# Patient Record
Sex: Male | Born: 1965 | Race: White | Hispanic: No | Marital: Single | Smoking: Current every day smoker
Health system: Southern US, Community
[De-identification: ages and names within clinical notes are randomized; demographics above are authoritative.]

## PROBLEM LIST (undated history)

## (undated) DIAGNOSIS — C801 Malignant (primary) neoplasm, unspecified: Secondary | ICD-10-CM

## (undated) DIAGNOSIS — N289 Disorder of kidney and ureter, unspecified: Secondary | ICD-10-CM

## (undated) DIAGNOSIS — I10 Essential (primary) hypertension: Secondary | ICD-10-CM

## (undated) HISTORY — PX: CARDIAC SURGERY: SHX584

## (undated) HISTORY — PX: LITHOTRIPSY: SUR834

## (undated) HISTORY — PX: CYSTOSCOPY: SUR368

---

## 2018-02-01 ENCOUNTER — Other Ambulatory Visit: Payer: Self-pay

## 2018-02-01 ENCOUNTER — Emergency Department (HOSPITAL_COMMUNITY): Payer: Self-pay

## 2018-02-01 ENCOUNTER — Encounter (HOSPITAL_COMMUNITY): Payer: Self-pay

## 2018-02-01 ENCOUNTER — Emergency Department (HOSPITAL_COMMUNITY)
Admission: EM | Admit: 2018-02-01 | Discharge: 2018-02-01 | Disposition: A | Discharge status: Home / Self Care | Payer: Self-pay | Attending: Emergency Medicine | Admitting: Emergency Medicine

## 2018-02-01 DIAGNOSIS — R1030 Lower abdominal pain, unspecified: Secondary | ICD-10-CM | POA: Insufficient documentation

## 2018-02-01 DIAGNOSIS — R109 Unspecified abdominal pain: Secondary | ICD-10-CM

## 2018-02-01 DIAGNOSIS — J069 Acute upper respiratory infection, unspecified: Secondary | ICD-10-CM | POA: Insufficient documentation

## 2018-02-01 DIAGNOSIS — Z859 Personal history of malignant neoplasm, unspecified: Secondary | ICD-10-CM | POA: Insufficient documentation

## 2018-02-01 DIAGNOSIS — F17290 Nicotine dependence, other tobacco product, uncomplicated: Secondary | ICD-10-CM | POA: Insufficient documentation

## 2018-02-01 DIAGNOSIS — I10 Essential (primary) hypertension: Secondary | ICD-10-CM | POA: Insufficient documentation

## 2018-02-01 DIAGNOSIS — R7989 Other specified abnormal findings of blood chemistry: Secondary | ICD-10-CM | POA: Insufficient documentation

## 2018-02-01 HISTORY — DX: Disorder of kidney and ureter, unspecified: N28.9

## 2018-02-01 HISTORY — DX: Essential (primary) hypertension: I10

## 2018-02-01 HISTORY — DX: Malignant (primary) neoplasm, unspecified: C80.1

## 2018-02-01 LAB — URINALYSIS, ROUTINE W REFLEX MICROSCOPIC
BILIRUBIN URINE: NEGATIVE
Bacteria, UA: NONE SEEN
Glucose, UA: NEGATIVE mg/dL
KETONES UR: NEGATIVE mg/dL
LEUKOCYTES UA: NEGATIVE
NITRITE: NEGATIVE
Protein, ur: NEGATIVE mg/dL
RBC / HPF: >50 RBC/hpf — ABNORMAL HIGH (ref 0–5)
SPECIFIC GRAVITY, URINE: 1.013 (ref 1.005–1.030)
pH: 6 (ref 5.0–8.0)

## 2018-02-01 LAB — CBC WITH DIFFERENTIAL/PLATELET
Abs Immature Granulocytes: 0.02 10*3/uL (ref 0.00–0.07)
Basophils Absolute: 0.1 10*3/uL (ref 0.0–0.1)
Basophils Relative: 1 %
EOS PCT: 6 %
Eosinophils Absolute: 0.3 10*3/uL (ref 0.0–0.5)
HEMATOCRIT: 43.8 % (ref 39.0–52.0)
HEMOGLOBIN: 13.6 g/dL (ref 13.0–17.0)
Immature Granulocytes: 0 %
LYMPHS PCT: 33 %
Lymphs Abs: 1.7 10*3/uL (ref 0.7–4.0)
MCH: 28 pg (ref 26.0–34.0)
MCHC: 31.1 g/dL (ref 30.0–36.0)
MCV: 90.3 fL (ref 80.0–100.0)
MONO ABS: 0.5 10*3/uL (ref 0.1–1.0)
Monocytes Relative: 9 %
Neutro Abs: 2.7 10*3/uL (ref 1.7–7.7)
Neutrophils Relative %: 51 %
Platelets: 231 10*3/uL (ref 150–400)
RBC: 4.85 MIL/uL (ref 4.22–5.81)
RDW: 13.1 % (ref 11.5–15.5)
WBC: 5.2 10*3/uL (ref 4.0–10.5)
nRBC: 0 % (ref 0.0–0.2)

## 2018-02-01 LAB — BASIC METABOLIC PANEL
Anion gap: 11 (ref 5–15)
BUN: 27 mg/dL — AB (ref 6–20)
CHLORIDE: 109 mmol/L (ref 98–111)
CO2: 25 mmol/L (ref 22–32)
CREATININE: 2.06 mg/dL — AB (ref 0.61–1.24)
Calcium: 9.7 mg/dL (ref 8.9–10.3)
GFR calc Af Amer: 41 mL/min — ABNORMAL LOW (ref >60)
GFR calc non Af Amer: 35 mL/min — ABNORMAL LOW (ref >60)
Glucose, Bld: 100 mg/dL — ABNORMAL HIGH (ref 70–99)
Potassium: 4.3 mmol/L (ref 3.5–5.1)
SODIUM: 145 mmol/L (ref 135–145)

## 2018-02-01 MED ORDER — ALBUTEROL SULFATE HFA 108 (90 BASE) MCG/ACT IN AERS
2.0000 | INHALATION_SPRAY | Freq: Once | RESPIRATORY_TRACT | Status: AC
  Administered 2018-02-01: 2 via RESPIRATORY_TRACT
  Filled 2018-02-01: qty 6.7

## 2018-02-01 MED ORDER — DEXAMETHASONE SODIUM PHOSPHATE 10 MG/ML IJ SOLN
10.0000 mg | Freq: Once | INTRAMUSCULAR | Status: DC
  Filled 2018-02-01: qty 1

## 2018-02-01 MED ORDER — PREDNISONE 20 MG PO TABS: 60.0000 mg | ORAL_TABLET | Freq: Every day | ORAL | 0 refills | Status: AC

## 2018-02-01 MED ORDER — DEXAMETHASONE SODIUM PHOSPHATE 10 MG/ML IJ SOLN
10.0000 mg | Freq: Once | INTRAMUSCULAR | Status: AC
  Administered 2018-02-01: 10 mg via INTRAMUSCULAR

## 2018-02-01 NOTE — ED Notes (Signed)
Unable to get blood work °

## 2018-02-01 NOTE — ED Triage Notes (Signed)
Patient arrives with initiial complaint left sided flank pain. Hx 3 lithotripsy's and 4 cystoscopes. Patient states pain had felt similar to kidney stones but when to the bathroom and thinks stone has been passed. Patient denies any current pain at this time.

## 2018-02-01 NOTE — Discharge Instructions (Signed)
Please take medications as directed.   Today your laboratory work showed an elevated BUN and creatinine at 27 and 2.06 respectively.  You will need to have your lab work redrawn in the next 3 to 5 days to recheck these levels.  Please follow up with your primary care provider within 3-5 days for re-evaluation of your symptoms. If you do not have a primary care provider, information for a healthcare clinic has been provided for you to make arrangements for follow up care. Please return to the emergency department for any new or worsening symptoms.

## 2018-02-01 NOTE — ED Notes (Signed)
Pt stated that he has had a moist productive cough x 4 days. Pt has expiratory wheezing bilaterally. Denies shortness of breath.

## 2018-02-01 NOTE — ED Notes (Signed)
Stone noted in urine cup upon giving urine sample.

## 2018-02-01 NOTE — ED Provider Notes (Signed)
[facility] DEPT Provider Note   CSN: [id number] Arrival date & time: 02/01/18  1515     History   Chief Complaint Chief Complaint  Patient presents with  . Flank Pain    HPI Robert Shepherd is a 52 y.o. male.  HPI   Patient is a 52 year old male with a history of CAD status post CABG, hypertension, hyperlipidemia, prediabetes, renal carcinoma status post bilateral partial nephrectomies, nephrolithiasis, who presents the emergency department today complaining of left-sided flank pain that began several days ago.  Patient states that his pain is consistent with prior kidney stones.  States he has a very long history of kidney stones and that he has had 82 of them in the past.  Has had 3 lithotripsies and 4 cystoscopies in the past.  States that while in the waiting room he urinated and passed the stone in the urine cup.  After that pain resolved.  Still reports that he has some mild suprapubic abdominal pain and dysuria, but otherwise his symptoms have resolved.  Denies significant frequency, urgency or hematuria.  No nausea, vomiting, diarrhea or constipation.  No chest pain or shortness of breath.  Of note he reports that he has had some recent URI symptoms.  About 3 days ago he developed a sore throat, he then developed nasal congestion and a productive cough.  Has had fevers up to 101F.  Denies any shortness of breath or chest pain.  Has had some wheezing which she does not normally have.  Patient vapes.  He denies a history of COPD. Has been taking delsym with improvement of his sxs.  Has been around sick contacts with similar symptoms.  Patient just moved to the area and is trying to get established with a PCP.  Past Medical History:  Diagnosis Date  . Cancer ([place])   . Hypertension   . Renal disorder     There are no active problems to display for this patient.   Past Surgical History:  Procedure Laterality Date  . CARDIAC SURGERY    .  CYSTOSCOPY    . LITHOTRIPSY          Home Medications    Prior to Admission medications   Medication Sig Start Date End Date Taking? Authorizing Provider  predniSONE (DELTASONE) 20 MG tablet Take 3 tablets (60 mg total) by mouth daily for 6 days. 02/01/18 02/07/18  Beauty Pless S, PA-C    Family History History reviewed. No pertinent family history.  Social History Social History   Tobacco Use  . Smoking status: Current Every Day Smoker    Types: E-cigarettes  Substance Use Topics  . Alcohol use: Not Currently  . Drug use: Not Currently     Allergies   Toradol [ketorolac tromethamine] and Sulfa antibiotics   Review of Systems Review of Systems  Constitutional: Positive for chills and fever.  HENT: Positive for congestion, postnasal drip and sore throat (resolved). Negative for rhinorrhea.   Eyes: Negative for visual disturbance.  Respiratory: Positive for cough and wheezing. Negative for shortness of breath.   Cardiovascular: Negative for chest pain and leg swelling.  Gastrointestinal: Positive for abdominal pain (suprapubic). Negative for constipation, diarrhea, nausea and vomiting.  Genitourinary: Positive for flank pain (resolved). Negative for dysuria, frequency, hematuria and urgency.  Musculoskeletal: Negative for back pain.  Skin: Negative for rash.  Neurological: Negative for dizziness, weakness, light-headedness, numbness and headaches.     Physical Exam Updated Vital Signs BP (!) 155/115 Comment: will take  medications ASAP  Pulse 88   Temp 98.1 F (36.7 C) (Oral)   Resp 20   Ht 5\' 5"  (1.651 m)   Wt 65.8 kg   SpO2 99%   BMI 24.13 kg/m   Physical Exam  Constitutional: He appears well-developed and well-nourished. No distress.  HENT:  Head: Normocephalic and atraumatic.  Mouth/Throat: Oropharynx is clear and moist.  Mild pharyngeal erythema. No tonsillar swelling or exudates.  Eyes: Conjunctivae are normal.  Neck: Neck supple.    Cardiovascular: Normal rate, regular rhythm and normal heart sounds.  No murmur heard. Pulmonary/Chest: Effort normal. No stridor. No respiratory distress. He has wheezes. He has no rales.  Decreased breath sounds throughout  Abdominal: Soft. Bowel sounds are normal. He exhibits no distension. There is no tenderness. There is no guarding.  Mild bilat CVA TTP  Musculoskeletal: Normal range of motion.  Neurological: He is alert.  Skin: Skin is warm and dry. He is not diaphoretic.  Psychiatric: He has a normal mood and affect.  Nursing note and vitals reviewed.    ED Treatments / Results  Labs (all labs ordered are listed, but only abnormal results are displayed) Labs Reviewed  URINALYSIS, ROUTINE W REFLEX MICROSCOPIC - Abnormal; Notable for the following components:      Result Value   Hgb urine dipstick LARGE (*)    RBC / HPF >50 (*)    All other components within normal limits  BASIC METABOLIC PANEL - Abnormal; Notable for the following components:   Glucose, Bld 100 (*)    BUN 27 (*)    Creatinine, Ser 2.06 (*)    GFR calc non Af Amer 35 (*)    GFR calc Af Amer 41 (*)    All other components within normal limits  URINE CULTURE  CBC WITH DIFFERENTIAL/PLATELET    EKG None  Radiology Dg Chest 2 View  Result Date: 02/01/2018 CLINICAL DATA:  Cough. EXAM: CHEST - 2 VIEW COMPARISON:  None. FINDINGS: Sequelae of left atrial appendage clipping are identified. The cardiomediastinal silhouette is within normal limits. No airspace consolidation, edema, pleural effusion, or pneumothorax is identified. Multiple surgical clips are present in the right upper abdomen/retroperitoneum. Degenerative changes are partially visualized at the left shoulder. IMPRESSION: No active cardiopulmonary disease. Electronically Signed   By: Logan Bores M.D.   On: 02/01/2018 17:41    Procedures Procedures (including critical care time)  Medications Ordered in ED Medications  albuterol (PROVENTIL  HFA;VENTOLIN HFA) 108 (90 Base) MCG/ACT inhaler 2 puff (2 puffs Inhalation Given 02/01/18 1730)  dexamethasone (DECADRON) injection 10 mg (10 mg Intramuscular Given 02/01/18 1730)     Initial Impression / Assessment and Plan / ED Course  I have reviewed the triage vital signs and the nursing notes.  Pertinent labs & imaging results that were available during my care of the patient were reviewed by me and considered in my medical decision making (see chart for details).  7:10 PM A consult was placed to case management to assist patient with obtaining primary care physician in the area.  Attempted contacting case management. Voicemail was left.  Final Clinical Impressions(s) / ED Diagnoses   Final diagnoses:  Flank pain  Elevated serum creatinine  Upper respiratory tract infection, unspecified type   Patient with left flank pain consistent with prior history of nephrolithiasis.  Passed stone while waiting to be seen.  UA with hematuria and >50 RBC.  No leukocytes or nitrites.  No evidence of UTI.  Will send for culture  given his dysuria.  Abdominal exam is benign and patient states that his symptoms have resolved since passing the stone in the ED. CBC without leukocytosis. BMP with elevated BUN and Cr to 27 and 2.06 respectively.  Unable to view prior lab work as patient is new to the area.  He states that his normal creatinine is around 1.6, though he has had issues with dehydration in the past and has sometimes had elevations in this number.  Discussed that patient will need to increase his hydration over the next several days and will need to follow-up for repeat lab work within the next 3 to 5 days.  He was given information for the [facility] and [facility] to have repeat labs drawn.  I also placed a consult to case management as above to help get him plugged in with primary care as soon as possible.  I advised patient of this plan.  Suspect his symptoms  today were due to ureteral stone.  Will give urology follow-up if he continues to have persistent symptoms of flank pain.  Have advised him to return if he experiences fevers, chills, vomiting abdominal pain or other signs of an infected stone.  Patient also with cough, fevers and other URI symptoms for the last several days.  Has wheezing throughout on exam and decreased airflow.  Decadron and albuterol inhaler given in the ED and patient feels improved.  Lung sounds improved.  CXR without evidence of pneumonia.  Will have patient continue albuterol inhaler as an outpatient until symptoms improve.  We will also give Rx for cough medication.  Suspect his symptoms are viral in nature.  We will have him follow-up with the [facility] and return if worse.  He voices understanding of the plan and reasons to return to the ED.  All questions answered.  ED Discharge Orders         Ordered    predniSONE (DELTASONE) 20 MG tablet  Daily     02/01/18 [address], [place] 02/01/18 1917    Gareth Morgan, MD 02/04/18 1205

## 2018-02-03 LAB — URINE CULTURE: Culture: NO GROWTH

## 2018-02-09 ENCOUNTER — Ambulatory Visit: Payer: Self-pay | Admitting: Family Medicine

## 2018-05-12 ENCOUNTER — Encounter (HOSPITAL_COMMUNITY): Payer: Self-pay

## 2018-05-12 ENCOUNTER — Emergency Department (HOSPITAL_COMMUNITY): Payer: Self-pay

## 2018-05-12 ENCOUNTER — Other Ambulatory Visit: Payer: Self-pay

## 2018-05-12 ENCOUNTER — Observation Stay (HOSPITAL_COMMUNITY)
Admission: EM | Admit: 2018-05-12 | Discharge: 2018-05-13 | Disposition: A | Discharge status: Home / Self Care | Payer: Self-pay | Attending: Internal Medicine | Admitting: Internal Medicine

## 2018-05-12 DIAGNOSIS — Z85528 Personal history of other malignant neoplasm of kidney: Secondary | ICD-10-CM | POA: Insufficient documentation

## 2018-05-12 DIAGNOSIS — I130 Hypertensive heart and chronic kidney disease with heart failure and stage 1 through stage 4 chronic kidney disease, or unspecified chronic kidney disease: Secondary | ICD-10-CM | POA: Insufficient documentation

## 2018-05-12 DIAGNOSIS — I509 Heart failure, unspecified: Secondary | ICD-10-CM | POA: Insufficient documentation

## 2018-05-12 DIAGNOSIS — I160 Hypertensive urgency: Secondary | ICD-10-CM | POA: Diagnosis present

## 2018-05-12 DIAGNOSIS — I2510 Atherosclerotic heart disease of native coronary artery without angina pectoris: Secondary | ICD-10-CM | POA: Insufficient documentation

## 2018-05-12 DIAGNOSIS — I161 Hypertensive emergency: Secondary | ICD-10-CM | POA: Insufficient documentation

## 2018-05-12 DIAGNOSIS — Z791 Long term (current) use of non-steroidal anti-inflammatories (NSAID): Secondary | ICD-10-CM | POA: Insufficient documentation

## 2018-05-12 DIAGNOSIS — R000 Tachycardia, unspecified: Secondary | ICD-10-CM | POA: Insufficient documentation

## 2018-05-12 DIAGNOSIS — Z882 Allergy status to sulfonamides status: Secondary | ICD-10-CM | POA: Insufficient documentation

## 2018-05-12 DIAGNOSIS — Z79899 Other long term (current) drug therapy: Secondary | ICD-10-CM | POA: Insufficient documentation

## 2018-05-12 DIAGNOSIS — K760 Fatty (change of) liver, not elsewhere classified: Secondary | ICD-10-CM | POA: Insufficient documentation

## 2018-05-12 DIAGNOSIS — R109 Unspecified abdominal pain: Principal | ICD-10-CM | POA: Insufficient documentation

## 2018-05-12 DIAGNOSIS — I25810 Atherosclerosis of coronary artery bypass graft(s) without angina pectoris: Secondary | ICD-10-CM

## 2018-05-12 DIAGNOSIS — N200 Calculus of kidney: Secondary | ICD-10-CM | POA: Insufficient documentation

## 2018-05-12 DIAGNOSIS — I712 Thoracic aortic aneurysm, without rupture: Secondary | ICD-10-CM | POA: Insufficient documentation

## 2018-05-12 DIAGNOSIS — Z905 Acquired absence of kidney: Secondary | ICD-10-CM | POA: Insufficient documentation

## 2018-05-12 DIAGNOSIS — N400 Enlarged prostate without lower urinary tract symptoms: Secondary | ICD-10-CM | POA: Insufficient documentation

## 2018-05-12 DIAGNOSIS — Z951 Presence of aortocoronary bypass graft: Secondary | ICD-10-CM | POA: Insufficient documentation

## 2018-05-12 DIAGNOSIS — F1510 Other stimulant abuse, uncomplicated: Secondary | ICD-10-CM | POA: Insufficient documentation

## 2018-05-12 DIAGNOSIS — E785 Hyperlipidemia, unspecified: Secondary | ICD-10-CM | POA: Insufficient documentation

## 2018-05-12 DIAGNOSIS — N183 Chronic kidney disease, stage 3 (moderate): Secondary | ICD-10-CM | POA: Insufficient documentation

## 2018-05-12 DIAGNOSIS — Z888 Allergy status to other drugs, medicaments and biological substances status: Secondary | ICD-10-CM | POA: Insufficient documentation

## 2018-05-12 LAB — RAPID URINE DRUG SCREEN, HOSP PERFORMED
Amphetamines: NOT DETECTED
Barbiturates: NOT DETECTED
Benzodiazepines: NOT DETECTED
Cocaine: NOT DETECTED
Opiates: NOT DETECTED
Tetrahydrocannabinol: NOT DETECTED

## 2018-05-12 LAB — CBC WITH DIFFERENTIAL/PLATELET
Abs Immature Granulocytes: 0.02 10*3/uL (ref 0.00–0.07)
Basophils Absolute: 0.1 10*3/uL (ref 0.0–0.1)
Basophils Relative: 2 %
Eosinophils Absolute: 0.8 10*3/uL — ABNORMAL HIGH (ref 0.0–0.5)
Eosinophils Relative: 11 %
HCT: 48.1 % (ref 39.0–52.0)
Hemoglobin: 15.7 g/dL (ref 13.0–17.0)
Immature Granulocytes: 0 %
Lymphocytes Relative: 20 %
Lymphs Abs: 1.4 10*3/uL (ref 0.7–4.0)
MCH: 27.9 pg (ref 26.0–34.0)
MCHC: 32.6 g/dL (ref 30.0–36.0)
MCV: 85.4 fL (ref 80.0–100.0)
Monocytes Absolute: 0.5 10*3/uL (ref 0.1–1.0)
Monocytes Relative: 8 %
Neutro Abs: 4 10*3/uL (ref 1.7–7.7)
Neutrophils Relative %: 59 %
Platelets: 289 10*3/uL (ref 150–400)
RBC: 5.63 MIL/uL (ref 4.22–5.81)
RDW: 12.8 % (ref 11.5–15.5)
WBC: 6.9 10*3/uL (ref 4.0–10.5)
nRBC: 0 % (ref 0.0–0.2)

## 2018-05-12 LAB — BASIC METABOLIC PANEL
Anion gap: 9 (ref 5–15)
BUN: 21 mg/dL — ABNORMAL HIGH (ref 6–20)
CO2: 22 mmol/L (ref 22–32)
Calcium: 9.5 mg/dL (ref 8.9–10.3)
Chloride: 106 mmol/L (ref 98–111)
Creatinine, Ser: 1.55 mg/dL — ABNORMAL HIGH (ref 0.61–1.24)
GFR calc Af Amer: 59 mL/min — ABNORMAL LOW (ref >60)
GFR calc non Af Amer: 51 mL/min — ABNORMAL LOW (ref >60)
Glucose, Bld: 102 mg/dL — ABNORMAL HIGH (ref 70–99)
Potassium: 4.1 mmol/L (ref 3.5–5.1)
Sodium: 137 mmol/L (ref 135–145)

## 2018-05-12 LAB — URINALYSIS, ROUTINE W REFLEX MICROSCOPIC
Bacteria, UA: NONE SEEN
Bilirubin Urine: NEGATIVE
Glucose, UA: NEGATIVE mg/dL
Hgb urine dipstick: NEGATIVE
Ketones, ur: NEGATIVE mg/dL
Leukocytes, UA: NEGATIVE
Nitrite: NEGATIVE
Protein, ur: 100 mg/dL — AB
Specific Gravity, Urine: 1.015 (ref 1.005–1.030)
pH: 6 (ref 5.0–8.0)

## 2018-05-12 LAB — HEPATIC FUNCTION PANEL
ALT: 19 U/L (ref 0–44)
AST: 18 U/L (ref 15–41)
Albumin: 4.1 g/dL (ref 3.5–5.0)
Alkaline Phosphatase: 60 U/L (ref 38–126)
Bilirubin, Direct: 0.2 mg/dL (ref 0.0–0.2)
Indirect Bilirubin: 0.7 mg/dL (ref 0.3–0.9)
Total Bilirubin: 0.9 mg/dL (ref 0.3–1.2)
Total Protein: 7.2 g/dL (ref 6.5–8.1)

## 2018-05-12 LAB — CREATININE, SERUM
Creatinine, Ser: 1.59 mg/dL — ABNORMAL HIGH (ref 0.61–1.24)
GFR calc Af Amer: 57 mL/min — ABNORMAL LOW (ref >60)
GFR calc non Af Amer: 49 mL/min — ABNORMAL LOW (ref >60)

## 2018-05-12 LAB — D-DIMER, QUANTITATIVE: D-Dimer, Quant: 0.36 ug/mL-FEU (ref 0.00–0.50)

## 2018-05-12 LAB — CBC
HCT: 43.9 % (ref 39.0–52.0)
Hemoglobin: 15 g/dL (ref 13.0–17.0)
MCH: 29 pg (ref 26.0–34.0)
MCHC: 34.2 g/dL (ref 30.0–36.0)
MCV: 84.9 fL (ref 80.0–100.0)
Platelets: 279 10*3/uL (ref 150–400)
RBC: 5.17 MIL/uL (ref 4.22–5.81)
RDW: 13.1 % (ref 11.5–15.5)
WBC: 8.3 10*3/uL (ref 4.0–10.5)
nRBC: 0 % (ref 0.0–0.2)

## 2018-05-12 LAB — BRAIN NATRIURETIC PEPTIDE: B Natriuretic Peptide: 586.3 pg/mL — ABNORMAL HIGH (ref 0.0–100.0)

## 2018-05-12 LAB — MAGNESIUM: Magnesium: 1.9 mg/dL (ref 1.7–2.4)

## 2018-05-12 LAB — PHOSPHORUS: Phosphorus: 3.4 mg/dL (ref 2.5–4.6)

## 2018-05-12 LAB — I-STAT TROPONIN, ED: Troponin i, poc: 0.04 ng/mL (ref 0.00–0.08)

## 2018-05-12 MED ORDER — CARVEDILOL PHOSPHATE ER 80 MG PO CP24
80.0000 mg | ORAL_CAPSULE | Freq: Every day | ORAL | Status: DC
  Administered 2018-05-12: 80 mg via ORAL
  Filled 2018-05-12 (×2): qty 1

## 2018-05-12 MED ORDER — CLONIDINE HCL 0.2 MG PO TABS
0.3000 mg | ORAL_TABLET | Freq: Three times a day (TID) | ORAL | Status: DC
  Administered 2018-05-12 – 2018-05-13 (×2): 0.3 mg via ORAL
  Filled 2018-05-12 (×2): qty 1

## 2018-05-12 MED ORDER — CARVEDILOL PHOSPHATE ER 20 MG PO CP24
80.0000 mg | ORAL_CAPSULE | Freq: Every day | ORAL | Status: DC
  Filled 2018-05-12 (×2): qty 1

## 2018-05-12 MED ORDER — IPRATROPIUM-ALBUTEROL 0.5-2.5 (3) MG/3ML IN SOLN
3.0000 mL | Freq: Four times a day (QID) | RESPIRATORY_TRACT | Status: DC
  Administered 2018-05-12: 3 mL via RESPIRATORY_TRACT

## 2018-05-12 MED ORDER — MORPHINE SULFATE (PF) 4 MG/ML IV SOLN
INTRAVENOUS | Status: AC
  Administered 2018-05-12: 13:00:00
  Filled 2018-05-12: qty 1

## 2018-05-12 MED ORDER — IPRATROPIUM-ALBUTEROL 0.5-2.5 (3) MG/3ML IN SOLN: 3.0000 mL | Freq: Four times a day (QID) | RESPIRATORY_TRACT | Status: DC | PRN

## 2018-05-12 MED ORDER — LIDOCAINE 5 % EX PTCH
1.0000 | MEDICATED_PATCH | CUTANEOUS | Status: DC
  Administered 2018-05-12: 1 via TRANSDERMAL
  Filled 2018-05-12: qty 1

## 2018-05-12 MED ORDER — IOPAMIDOL (ISOVUE-370) INJECTION 76%
100.0000 mL | Freq: Once | INTRAVENOUS | Status: AC | PRN
  Administered 2018-05-12: 100 mL via INTRAVENOUS

## 2018-05-12 MED ORDER — OXYCODONE-ACETAMINOPHEN 5-325 MG PO TABS: 1.0000 | ORAL_TABLET | ORAL | Status: DC | PRN

## 2018-05-12 MED ORDER — IOPAMIDOL (ISOVUE-370) INJECTION 76%
INTRAVENOUS | Status: AC
  Filled 2018-05-12: qty 100

## 2018-05-12 MED ORDER — LISINOPRIL 20 MG PO TABS
20.0000 mg | ORAL_TABLET | Freq: Two times a day (BID) | ORAL | Status: DC
  Administered 2018-05-12 – 2018-05-13 (×2): 20 mg via ORAL
  Filled 2018-05-12 (×2): qty 1

## 2018-05-12 MED ORDER — TAMSULOSIN HCL 0.4 MG PO CAPS
0.4000 mg | ORAL_CAPSULE | Freq: Every day | ORAL | Status: DC
  Administered 2018-05-13: 0.4 mg via ORAL
  Filled 2018-05-12: qty 1

## 2018-05-12 MED ORDER — CLONIDINE HCL 0.2 MG PO TABS
0.3000 mg | ORAL_TABLET | Freq: Once | ORAL | Status: AC
  Administered 2018-05-12: 0.3 mg via ORAL
  Filled 2018-05-12: qty 1

## 2018-05-12 MED ORDER — LABETALOL HCL 5 MG/ML IV SOLN
10.0000 mg | INTRAVENOUS | Status: DC | PRN
  Administered 2018-05-13: 10 mg via INTRAVENOUS
  Filled 2018-05-12: qty 4

## 2018-05-12 MED ORDER — LABETALOL HCL 5 MG/ML IV SOLN
10.0000 mg | Freq: Once | INTRAVENOUS | Status: AC
  Administered 2018-05-12: 10 mg via INTRAVENOUS
  Filled 2018-05-12: qty 4

## 2018-05-12 MED ORDER — HEPARIN SODIUM (PORCINE) 5000 UNIT/ML IJ SOLN
5000.0000 [IU] | Freq: Three times a day (TID) | INTRAMUSCULAR | Status: DC
  Administered 2018-05-12 – 2018-05-13 (×2): 5000 [IU] via SUBCUTANEOUS
  Filled 2018-05-12 (×2): qty 1

## 2018-05-12 MED ORDER — ATORVASTATIN CALCIUM 40 MG PO TABS
40.0000 mg | ORAL_TABLET | Freq: Every day | ORAL | Status: DC
  Administered 2018-05-13: 40 mg via ORAL
  Filled 2018-05-12: qty 1

## 2018-05-12 MED ORDER — AMLODIPINE BESYLATE 10 MG PO TABS
10.0000 mg | ORAL_TABLET | Freq: Every day | ORAL | Status: DC
  Administered 2018-05-12 – 2018-05-13 (×2): 10 mg via ORAL
  Filled 2018-05-12 (×2): qty 1

## 2018-05-12 NOTE — ED Notes (Signed)
Lidocaine patch removed, medication discontinued

## 2018-05-12 NOTE — ED Provider Notes (Signed)
Medical screening examination/treatment/procedure(s) were conducted as a shared visit with non-physician practitioner(s) and myself.  I personally evaluated the patient during the encounter. Briefly, the patient is a 53 y.o. male is a 53 year old male with history of cancer, hypertension who presents the ED with chest pain, flank pain, hypertension.  Patient with hypertension in the 517G systolic and elevated diastolic pressure as well.  Mildly tachycardic.  Patient on multiple blood pressure medications.  Has had some flank pain, chest pain.  No specific abdominal tenderness on exam.  Neurologically intact.  Patient has already been evaluated by the physician assistant lab work and imaging has been completed.  He has no signs of urinary tract infection.  Patient had negative UDS.  Troponin within normal limits.  EKG shows sinus tachycardia with T wave inversions throughout secondary to LVH.  No true ST elevation.  Patient with no significant leukocytosis, anemia, electrolyte abnormality.  CT of the abdomen and pelvis was unremarkable.  CT PE study also unremarkable.  BNP elevated.  Patient was given home blood pressure medications without much change in his blood pressure.  Patient with EKG with multiple T wave inversions concerning for ischemia likely secondary to uncontrolled hypertension.  Patient was given IV labetalol with improvement of blood pressure.  However given EKG changes, chest pain, hypertension will admit for further care.  This chart was dictated using voice recognition software.  Despite best efforts to proofread,  errors can occur which can change the documentation meaning.    EKG Interpretation  Date/Time:  Saturday May 12 2018 13:00:17 EST Ventricular Rate:  108 PR Interval:    QRS Duration: 98 QT Interval:  341 QTC Calculation: 457 R Axis:   -4 Text Interpretation:  Sinus tachycardia Probable left atrial enlargement LVH with secondary repolarization abnormality Confirmed by  Lennice Sites [phone]) on 05/12/2018 1:03:55 PM            Lennice Sites, DO 05/12/18 1630

## 2018-05-12 NOTE — ED Triage Notes (Signed)
Pt presents for evaluation of ongoing L flank pain x several months that has worsened over the past week. Hx of renal carcinoma and enlarged prostate. Pt is very hypertensive despite taking medication. Endorses dizziness.

## 2018-05-12 NOTE — ED Provider Notes (Signed)
[facility] Provider Note   CSN: [id number] Arrival date & time: 05/12/18  1145     History   Chief Complaint Chief Complaint  Patient presents with  . Flank Pain    HPI Robert Shepherd is a 53 y.o. male with history of CAD status post CABG, hypertension, hyperlipidemia, prediabetes, renal carcinoma status post bilateral partial nephrectomies, nephrolithiasis presenting for evaluation of gradual onset, progressively worsening flank pain, chest pain, and shortness of breath for several months.  He reports left flank pain which is constant, dull, worsens with with rest.  It radiates to the left lower abdomen.  He notes decreased urine output in the last several weeks.  He does have difficulty starting his stream however reports this is chronic due to enlarged prostate for which he takes tamsulosin.  Denies dysuria or hematuria.  No nausea, vomiting, or fevers.  He notes dyspnea on exertion which resolves with rest.  Denies orthopnea or leg swelling but was told that he did have heart failure.  He notes intermittent sharp left-sided chest pains that last for a few seconds and then resolve.  They are not exertional.  They have been ongoing for several months but he thinks that they have been worsening.  He has a history of triple bypass and stent placement but is not currently anticoagulated or on any antiplatelet therapy.  No recent travel or surgeries, no hemoptysis, no prior history of DVT or PE.  He is not on hormone placement therapy. He does use smokeless tobacco, denies recreational drug use for the last 7 months but has a history of polysubstance abuse.  He reports he ran out of his carvedilol 2 days ago but still has the remainder of his home medicines including his clonidine which he takes 3 times daily.  Reports that his blood pressure is typically 140/90.  The history is provided by the patient.    Past Medical History:  Diagnosis Date  . Cancer  ([place]   . Hypertension   . Renal disorder     There are no active problems to display for this patient.   Past Surgical History:  Procedure Laterality Date  . CARDIAC SURGERY    . CYSTOSCOPY    . LITHOTRIPSY          Home Medications    Prior to Admission medications   Medication Sig Start Date End Date Taking? Authorizing Provider  amLODipine (NORVASC) 5 MG tablet Take 5 mg by mouth daily.   Yes [provider]  atorvastatin (LIPITOR) 40 MG tablet Take 40 mg by mouth daily.   Yes [provider]  carvedilol (COREG CR) 80 MG 24 hr capsule Take 80 mg by mouth daily.   Yes [provider]  cloNIDine (CATAPRES) 0.3 MG tablet Take 0.3 mg by mouth 3 (three) times daily.   Yes [provider]  ibuprofen (ADVIL,MOTRIN) 200 MG tablet Take 400 mg by mouth 2 (two) times daily as needed for moderate pain.   Yes [provider]  lisinopril (PRINIVIL,ZESTRIL) 20 MG tablet Take 20 mg by mouth 2 (two) times daily.   Yes [provider]  tamsulosin (FLOMAX) 0.4 MG CAPS capsule Take 0.4 mg by mouth daily.   Yes [provider]    Family History No family history on file.  Social History Social History   Tobacco Use  . Smoking status: Current Every Day Smoker    Types: E-cigarettes  Substance Use Topics  . Alcohol use:  Not Currently  . Drug use: Not Currently     Allergies   Toradol [ketorolac tromethamine] and Sulfa antibiotics   Review of Systems Review of Systems  Constitutional: Negative for chills and fever.  Respiratory: Positive for shortness of breath.   Cardiovascular: Positive for chest pain.  Gastrointestinal: Positive for abdominal pain. Negative for nausea and vomiting.  Genitourinary: Positive for decreased urine volume, difficulty urinating and flank pain.  All other systems reviewed and are negative.    Physical Exam Updated Vital Signs BP (!) 164/110 (BP Location: Right Arm)   Pulse 85    Temp 98.4 F (36.9 C) (Oral)   Resp 15   Ht 5\' 5"  (1.651 m)   Wt 68 kg   SpO2 95%   BMI 24.96 kg/m   Physical Exam Vitals signs and nursing note reviewed.  Constitutional:      General: He is not in acute distress.    Appearance: He is well-developed.  HENT:     Head: Normocephalic and atraumatic.  Eyes:     General:        Right eye: No discharge.        Left eye: No discharge.     Conjunctiva/sclera: Conjunctivae normal.  Neck:     Vascular: No JVD.     Trachea: No tracheal deviation.  Cardiovascular:     Rate and Rhythm: Tachycardia present.     Pulses: Normal pulses.     Heart sounds: Normal heart sounds.     Comments: Well-healed midline sternotomy scar.  2+ radial and DP/PT pulses bilaterally, Homans sign absent bilaterally, no lower extremity edema, no palpable cords, compartments are soft  Pulmonary:     Effort: Pulmonary effort is normal.     Breath sounds: Normal breath sounds.  Abdominal:     General: A surgical scar is present. There is no distension.     Tenderness: There is abdominal tenderness in the right lower quadrant, suprapubic area, left upper quadrant and left lower quadrant. There is no right CVA tenderness, left CVA tenderness, guarding or rebound.     Comments: Multiple surgical scars  Skin:    General: Skin is warm and dry.     Findings: No erythema.  Neurological:     Mental Status: He is alert.  Psychiatric:        Behavior: Behavior normal.      ED Treatments / Results  Labs (all labs ordered are listed, but only abnormal results are displayed) Labs Reviewed  CBC WITH DIFFERENTIAL/PLATELET - Abnormal; Notable for the following components:      Result Value   Eosinophils Absolute 0.8 (*)    All other components within normal limits  BASIC METABOLIC PANEL - Abnormal; Notable for the following components:   Glucose, Bld 102 (*)    BUN 21 (*)    Creatinine, Ser 1.55 (*)    GFR calc non Af Amer 51 (*)    GFR calc Af Amer 59 (*)     All other components within normal limits  URINALYSIS, ROUTINE W REFLEX MICROSCOPIC - Abnormal; Notable for the following components:   Protein, ur 100 (*)    All other components within normal limits  BRAIN NATRIURETIC PEPTIDE - Abnormal; Notable for the following components:   B Natriuretic Peptide 586.3 (*)    All other components within normal limits  HEPATIC FUNCTION PANEL  D-DIMER, QUANTITATIVE (NOT AT [facility])  RAPID URINE DRUG SCREEN, HOSP PERFORMED  I-STAT TROPONIN, ED  EKG EKG Interpretation  Date/Time:  Saturday May 12 2018 13:00:17 EST Ventricular Rate:  108 PR Interval:    QRS Duration: 98 QT Interval:  341 QTC Calculation: 457 R Axis:   -4 Text Interpretation:  Sinus tachycardia Probable left atrial enlargement LVH with secondary repolarization abnormality Confirmed by Lennice Sites [phone]) on 05/12/2018 1:03:55 PM   Radiology Dg Chest 2 View  Result Date: 05/12/2018 CLINICAL DATA:  Chest pain and shortness of breath. Dizziness. Coronary artery disease. EXAM: CHEST - 2 VIEW COMPARISON:  02/01/2018 FINDINGS: The heart size and mediastinal contours are within normal limits. Prior CABG again noted. Both lungs are clear. The visualized skeletal structures are unremarkable. IMPRESSION: No active cardiopulmonary disease. Electronically Signed   By: Earle Gell M.D.   On: 05/12/2018 13:43   Ct Angio Chest Pe W And/or Wo Contrast  Result Date: 05/12/2018 CLINICAL DATA:  Left-sided chest and abdomen pain for several months, history of prior partial left nephrectomy for renal cell carcinoma in 2014 EXAM: CT ANGIOGRAPHY CHEST CT ABDOMEN AND PELVIS WITH CONTRAST TECHNIQUE: Multidetector CT imaging of the chest was performed using the standard protocol during bolus administration of intravenous contrast. Multiplanar CT image reconstructions and MIPs were obtained to evaluate the vascular anatomy. Multidetector CT imaging of the abdomen and pelvis was performed using the standard  protocol during bolus administration of intravenous contrast. CONTRAST:  122mL ISOVUE-370 COMPARISON:  None. FINDINGS: CTA CHEST FINDINGS Cardiovascular: Atherosclerotic changes of the thoracic aorta are noted. The ascending aorta measures 4.2 cm in diameter at the level of the main pulmonary artery. Pulmonary artery shows a normal branching pattern. No filling defects to suggest pulmonary emboli are noted. Left atrial clip is noted. Coronary calcifications are seen as well. Changes of coronary bypass grafting are noted but incompletely evaluated Mediastinum/Nodes: The esophagus is within normal limits. No sizable hilar or mediastinal adenopathy is noted. The thoracic inlet is unremarkable. Lungs/Pleura: The lungs are well aerated bilaterally. No focal infiltrate or sizable effusion is seen. No acute bony abnormality is noted. Musculoskeletal: No chest wall abnormality. No acute or significant osseous findings. Median sternotomy changes are noted. Review of the MIP images confirms the above findings. CT ABDOMEN and PELVIS FINDINGS Hepatobiliary: Fatty infiltration of the liver is noted. The gallbladder is within normal limits. Pancreas: Unremarkable. No pancreatic ductal dilatation or surrounding inflammatory changes. Spleen: Normal in size without focal abnormality. Adrenals/Urinary Tract: The left adrenal gland is within normal limits. The right adrenal gland appears of been surgically removed. Postsurgical changes adjacent to the right kidney are noted. Scattered nonobstructing renal calculi are noted bilaterally. Lobulation in left kidney is noted consistent with the prior surgical history. No obstructive changes are seen. Bladder is partially distended. Stomach/Bowel: Scattered diverticular change of the colon is noted. Appendix is well visualized and within normal limits. The stomach and small bowel are within normal limits. Vascular/Lymphatic: Aortic atherosclerosis. No enlarged abdominal or pelvic lymph  nodes. Reproductive: Prostate is unremarkable. Other: No abdominal wall hernia or abnormality. No abdominopelvic ascites. Musculoskeletal: Degenerative changes of lumbar spine are noted. 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 Review of the MIP images confirms the above findings. IMPRESSION: CT of the chest: No evidence of pulmonary emboli. 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 CT of the abdomen and pelvis: Chronic changes without acute abnormality. 1 Electronically Signed   By: Inez Catalina M.D.   On: 05/12/2018 15:01   Ct Abdomen Pelvis W Contrast  Result Date: 05/12/2018  CLINICAL DATA:  Left-sided chest and abdomen pain for several months, history of prior partial left nephrectomy for renal cell carcinoma in 2014 EXAM: CT ANGIOGRAPHY CHEST CT ABDOMEN AND PELVIS WITH CONTRAST TECHNIQUE: Multidetector CT imaging of the chest was performed using the standard protocol during bolus administration of intravenous contrast. Multiplanar CT image reconstructions and MIPs were obtained to evaluate the vascular anatomy. Multidetector CT imaging of the abdomen and pelvis was performed using the standard protocol during bolus administration of intravenous contrast. CONTRAST:  131mL ISOVUE-370 COMPARISON:  None. FINDINGS: CTA CHEST FINDINGS Cardiovascular: Atherosclerotic changes of the thoracic aorta are noted. The ascending aorta measures 4.2 cm in diameter at the level of the main pulmonary artery. Pulmonary artery shows a normal branching pattern. No filling defects to suggest pulmonary emboli are noted. Left atrial clip is noted. Coronary calcifications are seen as well. Changes of coronary bypass grafting are noted but incompletely evaluated Mediastinum/Nodes: The esophagus is within normal limits. No sizable hilar or mediastinal adenopathy is noted. The thoracic inlet is unremarkable. Lungs/Pleura: The lungs are well aerated bilaterally. No focal infiltrate or  sizable effusion is seen. No acute bony abnormality is noted. Musculoskeletal: No chest wall abnormality. No acute or significant osseous findings. Median sternotomy changes are noted. Review of the MIP images confirms the above findings. CT ABDOMEN and PELVIS FINDINGS Hepatobiliary: Fatty infiltration of the liver is noted. The gallbladder is within normal limits. Pancreas: Unremarkable. No pancreatic ductal dilatation or surrounding inflammatory changes. Spleen: Normal in size without focal abnormality. Adrenals/Urinary Tract: The left adrenal gland is within normal limits. The right adrenal gland appears of been surgically removed. Postsurgical changes adjacent to the right kidney are noted. Scattered nonobstructing renal calculi are noted bilaterally. Lobulation in left kidney is noted consistent with the prior surgical history. No obstructive changes are seen. Bladder is partially distended. Stomach/Bowel: Scattered diverticular change of the colon is noted. Appendix is well visualized and within normal limits. The stomach and small bowel are within normal limits. Vascular/Lymphatic: Aortic atherosclerosis. No enlarged abdominal or pelvic lymph nodes. Reproductive: Prostate is unremarkable. Other: No abdominal wall hernia or abnormality. No abdominopelvic ascites. Musculoskeletal: Degenerative changes of lumbar spine are noted. 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 Review of the MIP images confirms the above findings. IMPRESSION: CT of the chest: No evidence of pulmonary emboli. 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 CT of the abdomen and pelvis: Chronic changes without acute abnormality. 1 Electronically Signed   By: Inez Catalina M.D.   On: 05/12/2018 15:01    Procedures .Critical Care Performed by: Renita Papa, PA-C Authorized by: Renita Papa, PA-C   Critical care provider statement:    Critical care time (minutes):  35   Critical care was  necessary to treat or prevent imminent or life-threatening deterioration of the following conditions:  Circulatory failure   Critical care was time spent personally by me on the following activities:  Discussions with consultants, evaluation of patient's response to treatment, examination of patient, ordering and performing treatments and interventions, ordering and review of laboratory studies, ordering and review of radiographic studies, pulse oximetry, re-evaluation of patient's condition, obtaining history from patient or surrogate and review of old charts   I assumed direction of critical care for this patient from another provider in my specialty: no     (including critical care time)  Medications Ordered in ED Medications  iopamidol (ISOVUE-370) 76 % injection (has no administration in time range)  carvedilol (COREG CR) 24 hr capsule 80 mg (80 mg Oral Given 05/12/18 1513)  morphine 4 MG/ML injection (  Given 05/12/18 1325)  cloNIDine (CATAPRES) tablet 0.3 mg (0.3 mg Oral Given 05/12/18 1448)  iopamidol (ISOVUE-370) 76 % injection 100 mL (100 mLs Intravenous Contrast Given 05/12/18 1415)  labetalol (NORMODYNE,TRANDATE) injection 10 mg (10 mg Intravenous Given 05/12/18 1532)     Initial Impression / Assessment and Plan / ED Course  I have reviewed the triage vital signs and the nursing notes.  Pertinent labs & imaging results that were available during my care of the patient were reviewed by me and considered in my medical decision making (see chart for details).     Patient presenting for evaluation of left flank pain, chest pains, and shortness of breath which is been worsening.  He is afebrile, persistently tachycardic and hypertensive while in the ED. No peritoneal signs on examination of the abdomen. However, with history of renal carcinoma and tachycardia, will obtain PE study and imaging of the abdomen/pelvis to rule out PE or acute surgical abdominal pathology.   EKG shows sinus  tachycardia, T wave inversions, with findings suggestive of LVH.  Labs show renal insufficiency with elevated BUN and creatinine, no metabolic derangements.  No leukocytosis or anemia.  His UDS is negative.  Imaging shows no evidence of PE or acute surgical abdominal pathology.  Does show mild aneurysmal dilatation of the ascending aorta at 4.2 cm.  While in the ED, his blood pressure was difficult to control even after multiple doses of some of his home medications, modest improvement with IV labetalol.  Tried hospital service to admit for further evaluation and management. Patient seen and evaluated by Dr. Ronnald Nian who agrees with assessment and plan at this time.   Final Clinical Impressions(s) / ED Diagnoses   Final diagnoses:  Hypertensive emergency    ED Discharge Orders    None       Renita Papa, PA-C 05/12/18 1634    Lennice Sites, DO 05/12/18 1643

## 2018-05-12 NOTE — H&P (Signed)
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  Patient presents with left-sided flank pain that has been going on for 6 months.  According to the patient, the flank pain got worse recently, leading to current presentation to the emergency room.  On presentation to the ER, the blood pressure was significantly accelerated, said to be 223/164 mmHg, with a heart rate of 116 to 120 bpm.  Patient has been treated in the ER with IV labetalol.  Currently, the blood pressure is 164/113 mmHg, with heart rate of 84 bpm.  EKG reveals LVH with repolarization.  Pertinent lab work reveals serum creatinine of 1.55, cardiac BNP of 586.  Chest x-ray has not revealed any significant findings.  T scan of the chest was negative for pulmonary embolism, but revealed 4.2 cm ascending aortic aneurysm.  CT scan of the abdomen and pelvis done with contrast revealed fatty liver, nephrolithiasis, otherwise no acute findings.  Hospitalist team has been asked to admit patient for further assessment and management.  On further questioning, patient denied headache, no neck pain, no chest pain, no shortness of breath, no GI symptoms and no urinary symptoms.  ED Course: As documented above. Pertinent labs: As documented above. EKG: Independently reviewed.  Imaging: independently reviewed.   Review of Systems: Negative for fever, visual changes, sore throat, rash, new muscle aches, chest pain, SOB, dysuria, bleeding,  n/v/abdominal pain.  Past Medical History:  Diagnosis Date  . Cancer ([place])   . Hypertension   . Renal disorder     Past Surgical History:  Procedure Laterality Date  . CARDIAC SURGERY    . CYSTOSCOPY    . LITHOTRIPSY       reports that he has been smoking e-cigarettes. He does not have any smokeless tobacco history on file. He reports previous alcohol use. He reports previous drug use.  Allergies  Allergen Reactions  . Toradol [Ketorolac Tromethamine] Shortness Of Breath  . Sulfa Antibiotics     Bells palsy     No family history on file.   Prior to Admission medications   Medication Sig Start Date End Date Taking? Authorizing Provider  amLODipine (NORVASC) 5 MG tablet Take 5 mg by mouth daily.   Yes [provider]  atorvastatin (LIPITOR) 40 MG tablet Take 40 mg by mouth daily.   Yes [provider]  carvedilol (COREG CR) 80 MG 24 hr capsule Take 80 mg by mouth daily.   Yes [provider]  cloNIDine (CATAPRES) 0.3 MG tablet Take 0.3 mg by mouth 3 (three) times daily.   Yes [provider]  ibuprofen (ADVIL,MOTRIN) 200 MG tablet Take 400 mg by mouth 2 (two) times daily as needed for moderate pain.   Yes [provider]  lisinopril (PRINIVIL,ZESTRIL) 20 MG tablet Take 20 mg by mouth 2 (two) times daily.   Yes [provider]  tamsulosin (FLOMAX) 0.4 MG CAPS capsule Take 0.4 mg by mouth daily.   Yes [provider]    Physical Exam: Vitals:   05/12/18 1615 05/12/18 1623 05/12/18 1630 05/12/18  1645  BP: (!) 164/133 (!) 164/110 (!) 161/133 (!) 160/113  Pulse: 95  85 85  Resp: 18  16 18   Temp:      TempSrc:      SpO2: 94%  94% 96%  Weight:      Height:         Constitutional:  . Appears calm and comfortable Eyes:  . No pallor. No jaundice.  ENMT:  . external ears, nose appear normal Neck:  . Neck is supple. No JVD Respiratory:  Expiratory wheeze.  Decreased air entry globally.    Cardiovascular:   . S1S2 . No LE extremity edema   Abdomen:  . Abdomen is obese, soft and nontender.  Organs are difficult to assess. Neurologic:  . Awake and alert. . Moves all limbs.  Wt Readings from Last 3 Encounters:  05/12/18 68 kg  02/01/18 65.8 kg    I have personally reviewed following labs and imaging studies  Labs on Admission:  CBC: Recent Labs  Lab 05/12/18 1205  WBC 6.9  NEUTROABS 4.0  HGB 15.7  HCT 48.1  MCV 85.4  PLT 500   Basic Metabolic Panel: Recent Labs  Lab 05/12/18 1205  NA 137  K 4.1  CL 106  CO2 22  GLUCOSE 102*  BUN 21*  CREATININE 1.55*  CALCIUM 9.5   Liver Function Tests: Recent Labs  Lab 05/12/18 1205  AST 18  ALT 19  ALKPHOS 60  BILITOT 0.9  PROT 7.2  ALBUMIN 4.1   No results for input(s): LIPASE, AMYLASE in the last 168 hours. No results for input(s): AMMONIA in the last 168 hours. Coagulation Profile: No results for input(s): INR, PROTIME in the last 168 hours. Cardiac Enzymes: No results for input(s): CKTOTAL, CKMB, CKMBINDEX, TROPONINI in the last 168 hours. BNP (last 3 results) No results for input(s): PROBNP in the last 8760 hours. HbA1C: No results for input(s): HGBA1C in the last 72 hours. CBG: No results for input(s): GLUCAP in the last 168 hours. Lipid Profile: No results for input(s): CHOL, HDL, LDLCALC, TRIG, CHOLHDL, LDLDIRECT in the last 72 hours. Thyroid Function Tests: No results for input(s): TSH, T4TOTAL, FREET4, T3FREE, THYROIDAB in the last 72 hours. Anemia Panel: No results for input(s): VITAMINB12, FOLATE, FERRITIN, TIBC, IRON, RETICCTPCT in the last 72 hours. Urine analysis:    Component Value Date/Time   COLORURINE YELLOW 05/12/2018 1310   APPEARANCEUR CLEAR 05/12/2018 1310   LABSPEC 1.015 05/12/2018 1310   PHURINE 6.0 05/12/2018 1310   GLUCOSEU NEGATIVE 05/12/2018 1310   HGBUR NEGATIVE 05/12/2018 1310   BILIRUBINUR NEGATIVE 05/12/2018 1310   KETONESUR NEGATIVE 05/12/2018 1310   PROTEINUR 100 (A)  05/12/2018 1310   NITRITE NEGATIVE 05/12/2018 1310   LEUKOCYTESUR NEGATIVE 05/12/2018 1310   Sepsis Labs: @LABRCNTIP (procalcitonin:4,lacticidven:4) )No results found for this or any previous visit (from the past 240 hour(s)).    Radiological Exams on Admission: Dg Chest 2 View  Result Date: 05/12/2018 CLINICAL DATA:  Chest pain and shortness of breath. Dizziness. Coronary artery disease. EXAM: CHEST - 2 VIEW COMPARISON:  02/01/2018 FINDINGS: The heart size and mediastinal contours are within normal limits. Prior CABG again noted. Both lungs are clear. The visualized skeletal structures are unremarkable. IMPRESSION: No active cardiopulmonary disease. Electronically Signed   By: Earle Gell M.D.   On: 05/12/2018 13:43   Ct Angio Chest Pe W And/or Wo Contrast  Result Date: 05/12/2018 CLINICAL DATA:  Left-sided chest and abdomen pain for several months, history of prior partial  left nephrectomy for renal cell carcinoma in 2014 EXAM: CT ANGIOGRAPHY CHEST CT ABDOMEN AND PELVIS WITH CONTRAST TECHNIQUE: Multidetector CT imaging of the chest was performed using the standard protocol during bolus administration of intravenous contrast. Multiplanar CT image reconstructions and MIPs were obtained to evaluate the vascular anatomy. Multidetector CT imaging of the abdomen and pelvis was performed using the standard protocol during bolus administration of intravenous contrast. CONTRAST:  144mL ISOVUE-370 COMPARISON:  None. FINDINGS: CTA CHEST FINDINGS Cardiovascular: Atherosclerotic changes of the thoracic aorta are noted. The ascending aorta measures 4.2 cm in diameter at the level of the main pulmonary artery. Pulmonary artery shows a normal branching pattern. No filling defects to suggest pulmonary emboli are noted. Left atrial clip is noted. Coronary calcifications are seen as well. Changes of coronary bypass grafting are noted but incompletely evaluated Mediastinum/Nodes: The esophagus is within normal limits.  No sizable hilar or mediastinal adenopathy is noted. The thoracic inlet is unremarkable. Lungs/Pleura: The lungs are well aerated bilaterally. No focal infiltrate or sizable effusion is seen. No acute bony abnormality is noted. Musculoskeletal: No chest wall abnormality. No acute or significant osseous findings. Median sternotomy changes are noted. Review of the MIP images confirms the above findings. CT ABDOMEN and PELVIS FINDINGS Hepatobiliary: Fatty infiltration of the liver is noted. The gallbladder is within normal limits. Pancreas: Unremarkable. No pancreatic ductal dilatation or surrounding inflammatory changes. Spleen: Normal in size without focal abnormality. Adrenals/Urinary Tract: The left adrenal gland is within normal limits. The right adrenal gland appears of been surgically removed. Postsurgical changes adjacent to the right kidney are noted. Scattered nonobstructing renal calculi are noted bilaterally. Lobulation in left kidney is noted consistent with the prior surgical history. No obstructive changes are seen. Bladder is partially distended. Stomach/Bowel: Scattered diverticular change of the colon is noted. Appendix is well visualized and within normal limits. The stomach and small bowel are within normal limits. Vascular/Lymphatic: Aortic atherosclerosis. No enlarged abdominal or pelvic lymph nodes. Reproductive: Prostate is unremarkable. Other: No abdominal wall hernia or abnormality. No abdominopelvic ascites. Musculoskeletal: Degenerative changes of lumbar spine are noted. 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 Review of the MIP images confirms the above findings. IMPRESSION: CT of the chest: No evidence of pulmonary emboli. 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 CT of the abdomen and pelvis: Chronic changes without acute abnormality. 1 Electronically Signed   By: Inez Catalina M.D.   On: 05/12/2018 15:01   Ct Abdomen Pelvis W  Contrast  Result Date: 05/12/2018 CLINICAL DATA:  Left-sided chest and abdomen pain for several months, history of prior partial left nephrectomy for renal cell carcinoma in 2014 EXAM: CT ANGIOGRAPHY CHEST CT ABDOMEN AND PELVIS WITH CONTRAST TECHNIQUE: Multidetector CT imaging of the chest was performed using the standard protocol during bolus administration of intravenous contrast. Multiplanar CT image reconstructions and MIPs were obtained to evaluate the vascular anatomy. Multidetector CT imaging of the abdomen and pelvis was performed using the standard protocol during bolus administration of intravenous contrast. CONTRAST:  118mL ISOVUE-370 COMPARISON:  None. FINDINGS: CTA CHEST FINDINGS Cardiovascular: Atherosclerotic changes of the thoracic aorta are noted. The ascending aorta measures 4.2 cm in diameter at the level of the main pulmonary artery. Pulmonary artery shows a normal branching pattern. No filling defects to suggest pulmonary emboli are noted. Left atrial clip is noted. Coronary calcifications are seen as well. Changes of coronary bypass grafting are noted but incompletely evaluated Mediastinum/Nodes: The esophagus is within normal  limits. No sizable hilar or mediastinal adenopathy is noted. The thoracic inlet is unremarkable. Lungs/Pleura: The lungs are well aerated bilaterally. No focal infiltrate or sizable effusion is seen. No acute bony abnormality is noted. Musculoskeletal: No chest wall abnormality. No acute or significant osseous findings. Median sternotomy changes are noted. Review of the MIP images confirms the above findings. CT ABDOMEN and PELVIS FINDINGS Hepatobiliary: Fatty infiltration of the liver is noted. The gallbladder is within normal limits. Pancreas: Unremarkable. No pancreatic ductal dilatation or surrounding inflammatory changes. Spleen: Normal in size without focal abnormality. Adrenals/Urinary Tract: The left adrenal gland is within normal limits. The right adrenal gland  appears of been surgically removed. Postsurgical changes adjacent to the right kidney are noted. Scattered nonobstructing renal calculi are noted bilaterally. Lobulation in left kidney is noted consistent with the prior surgical history. No obstructive changes are seen. Bladder is partially distended. Stomach/Bowel: Scattered diverticular change of the colon is noted. Appendix is well visualized and within normal limits. The stomach and small bowel are within normal limits. Vascular/Lymphatic: Aortic atherosclerosis. No enlarged abdominal or pelvic lymph nodes. Reproductive: Prostate is unremarkable. Other: No abdominal wall hernia or abnormality. No abdominopelvic ascites. Musculoskeletal: Degenerative changes of lumbar spine are noted. 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 Review of the MIP images confirms the above findings. IMPRESSION: CT of the chest: No evidence of pulmonary emboli. 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 CT of the abdomen and pelvis: Chronic changes without acute abnormality. 1 Electronically Signed   By: Inez Catalina M.D.   On: 05/12/2018 15:01    EKG: Independently reviewed.   Active Problems:   * No active hospital problems. *   Assessment/Plan Left flank pain: Cause remains unclear Possibly muscular skeletal Adequate pain control. Further management depend on hospital course.  Hypertensive urgency: Restart home medications. Adequate pain control IV labetalol PRN.  Coronary artery disease status post CABG: Stable. Continue to monitor.  Hyperlipidemia: Continue statins.  History of BPH: Continue Flomax.  History of bilateral renal cancer status post subtotal nephrectomy: Monitor Follow-up with urologist/oncologist on discharge.  History of illicit substance abuse (methamphetamine): Minimize opiate use.  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 Prior history of elevated serum creatinine  noted.  DVT prophylaxis: Subcutaneous heparin Code Status: Full Family Communication:  Disposition Plan: Patient will be discharged back to rehab facility eventually Consults called: None Admission status: Observation  Time spent: 65 minutes.   Dana Allan, MD  [facility] Pager #: [phone] 7PM-7AM contact night coverage as above  05/12/2018, 5:28 PM

## 2018-05-13 LAB — CBC
HCT: 43.4 % (ref 39.0–52.0)
Hemoglobin: 14.3 g/dL (ref 13.0–17.0)
MCH: 28.4 pg (ref 26.0–34.0)
MCHC: 32.9 g/dL (ref 30.0–36.0)
MCV: 86.3 fL (ref 80.0–100.0)
Platelets: 259 10*3/uL (ref 150–400)
RBC: 5.03 MIL/uL (ref 4.22–5.81)
RDW: 13.1 % (ref 11.5–15.5)
WBC: 6.6 10*3/uL (ref 4.0–10.5)
nRBC: 0 % (ref 0.0–0.2)

## 2018-05-13 LAB — HIV ANTIBODY (ROUTINE TESTING W REFLEX): HIV Screen 4th Generation wRfx: NONREACTIVE

## 2018-05-13 LAB — BASIC METABOLIC PANEL
Anion gap: 10 (ref 5–15)
BUN: 29 mg/dL — ABNORMAL HIGH (ref 6–20)
CO2: 23 mmol/L (ref 22–32)
Calcium: 8.9 mg/dL (ref 8.9–10.3)
Chloride: 104 mmol/L (ref 98–111)
Creatinine, Ser: 1.63 mg/dL — ABNORMAL HIGH (ref 0.61–1.24)
GFR calc Af Amer: 55 mL/min — ABNORMAL LOW (ref >60)
GFR calc non Af Amer: 48 mL/min — ABNORMAL LOW (ref >60)
Glucose, Bld: 94 mg/dL (ref 70–99)
Potassium: 3.7 mmol/L (ref 3.5–5.1)
Sodium: 137 mmol/L (ref 135–145)

## 2018-05-13 MED ORDER — CLONIDINE HCL 0.3 MG PO TABS: 0.3000 mg | ORAL_TABLET | Freq: Three times a day (TID) | ORAL | 1 refills | Status: DC

## 2018-05-13 MED ORDER — CARVEDILOL 25 MG PO TABS: 25.0000 mg | ORAL_TABLET | Freq: Two times a day (BID) | ORAL | 1 refills | Status: DC

## 2018-05-13 MED ORDER — METHOCARBAMOL 500 MG PO TABS: 500.0000 mg | ORAL_TABLET | Freq: Three times a day (TID) | ORAL | 0 refills | Status: DC | PRN

## 2018-05-13 MED ORDER — CARVEDILOL 25 MG PO TABS: 25.0000 mg | ORAL_TABLET | Freq: Two times a day (BID) | ORAL | Status: DC

## 2018-05-13 MED ORDER — LISINOPRIL 20 MG PO TABS: 20.0000 mg | ORAL_TABLET | Freq: Two times a day (BID) | ORAL | 0 refills | Status: DC

## 2018-05-13 MED ORDER — TAMSULOSIN HCL 0.4 MG PO CAPS: 0.4000 mg | ORAL_CAPSULE | Freq: Every day | ORAL | 1 refills | Status: DC

## 2018-05-13 MED ORDER — METHOCARBAMOL 500 MG PO TABS
500.0000 mg | ORAL_TABLET | Freq: Three times a day (TID) | ORAL | Status: DC | PRN
  Administered 2018-05-13: 500 mg via ORAL
  Filled 2018-05-13: qty 1

## 2018-05-13 MED ORDER — AMLODIPINE BESYLATE 10 MG PO TABS: 10.0000 mg | ORAL_TABLET | Freq: Every day | ORAL | 1 refills | Status: DC

## 2018-05-13 MED ORDER — ATORVASTATIN CALCIUM 40 MG PO TABS: 40.0000 mg | ORAL_TABLET | Freq: Every day | ORAL | 1 refills | Status: DC

## 2018-05-13 NOTE — Progress Notes (Signed)
Spoke w patient to discuss how he was going to pay for his DC meds. He states that he works, and uses [facility]. He discussed cost w doctor and will not have any difficulties affording meds. We discussed drug discount plan at [facility] and how a lot of his meds would cheaper for him there with the $36 year subscription. He was very [employer] attorney. No further CM needs identified.

## 2018-05-13 NOTE — Progress Notes (Signed)
Patient was stable at discharge. I removed their IV. We reviewed the discharge education. Patient/Family verbalized understanding and had no further questions. Patient left with prescription/s in hand.  

## 2018-05-13 NOTE — Discharge Summary (Signed)
Physician Discharge Summary  [name]:[id number] DOB: 1966/04/14 DOA: 05/12/2018  PCP: Robert Shepherd, No Pcp Per  Admit date: 05/12/2018 Discharge date: 05/13/2018  Admitted From: home Discharge disposition: home   Recommendations for Outpatient Follow-Up:   1. Robert Shepherd has PCP in [facility] but is here months at a time, will establish with PCP here 2. Refills of medications given   Discharge Diagnosis:   Active Problems:   Hypertensive urgency    Discharge Condition: Improved.  Diet recommendation: Low sodium, heart healthy  Wound care: None.  Code status: Full.   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  Robert Shepherd presents with left-sided flank pain that has been going on for 6 months.  According to the Robert Shepherd, the flank pain got worse recently, leading to current presentation to the emergency room.  On presentation to the ER, the blood pressure was significantly accelerated, said to be 223/164 mmHg, with a heart rate of 116 to 120 bpm.   Hospital Course by Problem:   Left flank pain: -has degenerative back disease -exercise recommendations given -avoid narcotics  Hypertensive urgency: Restart home medications with some adjustments as below -Robert Shepherd out of most so prescriptions given  Coronary artery disease status post CABG: Stable. Continue to monitor.  Hyperlipidemia: Continue statins.  History of BPH: Continue Flomax.  History of bilateral renal cancer status post subtotal nephrectomy: -CT scan stable -outpatient follow up  History of illicit substance abuse (methamphetamine): -UDS clean -no narcotics given  CKD 3: Probably due to uncontrolled BP    Medical Consultants:       Discharge Exam:   Vitals:   05/13/18 0828 05/13/18 0836  BP: (!) 136/108 125/90  Pulse: 78   Resp: 12   Temp: 98 F (36.7 C)   SpO2: 96%    Vitals:   05/13/18 0354 05/13/18 0534 05/13/18 0828 05/13/18 0836  BP: 118/79 106/90 (!) 136/108 125/90  Pulse: 72 73 78   Resp: 20 18 12    Temp: 98 F (36.7 C) 97.6 F (36.4 C) 98 F (36.7 C)   TempSrc: Oral Oral Oral   SpO2: 93% 93% 96%   Weight:      Height:        General exam: Appears calm and comfortable.  The results of significant diagnostics from this hospitalization (including imaging, microbiology, ancillary and laboratory) are listed below for reference.     Procedures and Diagnostic Studies:   Dg Chest 2 View  Result Date: 05/12/2018 CLINICAL DATA:  Chest pain and shortness of breath. Dizziness. Coronary artery disease. EXAM: CHEST - 2 VIEW COMPARISON:  02/01/2018 FINDINGS: The heart size and mediastinal contours are within normal limits. Prior CABG again noted. Both lungs are clear. The visualized skeletal structures are unremarkable. IMPRESSION: No active cardiopulmonary disease. Electronically Signed   By: Earle Gell M.D.   On: 05/12/2018 13:43   Ct Angio Chest Pe W And/or Wo Contrast  Result Date: 05/12/2018 CLINICAL DATA:  Left-sided chest and abdomen pain for several months, history of prior partial left nephrectomy for renal cell carcinoma in 2014 EXAM: CT ANGIOGRAPHY CHEST CT ABDOMEN AND PELVIS WITH CONTRAST TECHNIQUE: Multidetector CT imaging of the chest was performed using the standard protocol during bolus administration of intravenous contrast. Multiplanar CT image reconstructions and MIPs were  obtained to evaluate the vascular anatomy. Multidetector CT imaging of the abdomen and pelvis was performed using the standard protocol during bolus administration of intravenous contrast. CONTRAST:  180mL ISOVUE-370 COMPARISON:  None. FINDINGS: CTA CHEST FINDINGS Cardiovascular: Atherosclerotic changes of the  thoracic aorta are noted. The ascending aorta measures 4.2 cm in diameter at the level of the main pulmonary artery. Pulmonary artery shows a normal branching pattern. No filling defects to suggest pulmonary emboli are noted. Left atrial clip is noted. Coronary calcifications are seen as well. Changes of coronary bypass grafting are noted but incompletely evaluated Mediastinum/Nodes: The esophagus is within normal limits. No sizable hilar or mediastinal adenopathy is noted. The thoracic inlet is unremarkable. Lungs/Pleura: The lungs are well aerated bilaterally. No focal infiltrate or sizable effusion is seen. No acute bony abnormality is noted. Musculoskeletal: No chest wall abnormality. No acute or significant osseous findings. Median sternotomy changes are noted. Review of the MIP images confirms the above findings. CT ABDOMEN and PELVIS FINDINGS Hepatobiliary: Fatty infiltration of the liver is noted. The gallbladder is within normal limits. Pancreas: Unremarkable. No pancreatic ductal dilatation or surrounding inflammatory changes. Spleen: Normal in size without focal abnormality. Adrenals/Urinary Tract: The left adrenal gland is within normal limits. The right adrenal gland appears of been surgically removed. Postsurgical changes adjacent to the right kidney are noted. Scattered nonobstructing renal calculi are noted bilaterally. Lobulation in left kidney is noted consistent with the prior surgical history. No obstructive changes are seen. Bladder is partially distended. Stomach/Bowel: Scattered diverticular change of the colon is noted. Appendix is well visualized and within normal limits. The stomach and small bowel are within normal limits. Vascular/Lymphatic: Aortic atherosclerosis. No enlarged abdominal or pelvic lymph nodes. Reproductive: Prostate is unremarkable. Other: No abdominal wall hernia or abnormality. No abdominopelvic ascites. Musculoskeletal: Degenerative changes of lumbar spine are noted.  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 Review of the MIP images confirms the above findings. IMPRESSION: CT of the chest: No evidence of pulmonary emboli. 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 CT of the abdomen and pelvis: Chronic changes without acute abnormality. 1 Electronically Signed   By: Inez Catalina M.D.   On: 05/12/2018 15:01   Ct Abdomen Pelvis W Contrast  Result Date: 05/12/2018 CLINICAL DATA:  Left-sided chest and abdomen pain for several months, history of prior partial left nephrectomy for renal cell carcinoma in 2014 EXAM: CT ANGIOGRAPHY CHEST CT ABDOMEN AND PELVIS WITH CONTRAST TECHNIQUE: Multidetector CT imaging of the chest was performed using the standard protocol during bolus administration of intravenous contrast. Multiplanar CT image reconstructions and MIPs were obtained to evaluate the vascular anatomy. Multidetector CT imaging of the abdomen and pelvis was performed using the standard protocol during bolus administration of intravenous contrast. CONTRAST:  186mL ISOVUE-370 COMPARISON:  None. FINDINGS: CTA CHEST FINDINGS Cardiovascular: Atherosclerotic changes of the thoracic aorta are noted. The ascending aorta measures 4.2 cm in diameter at the level of the main pulmonary artery. Pulmonary artery shows a normal branching pattern. No filling defects to suggest pulmonary emboli are noted. Left atrial clip is noted. Coronary calcifications are seen as well. Changes of coronary bypass grafting are noted but incompletely evaluated Mediastinum/Nodes: The esophagus is within normal limits. No sizable hilar or mediastinal adenopathy is noted. The thoracic inlet is unremarkable. Lungs/Pleura: The lungs are well aerated bilaterally. No focal infiltrate or sizable effusion is seen. No acute bony abnormality is noted. Musculoskeletal: No chest wall abnormality. No acute or significant  osseous findings. Median sternotomy changes are noted.  Review of the MIP images confirms the above findings. CT ABDOMEN and PELVIS FINDINGS Hepatobiliary: Fatty infiltration of the liver is noted. The gallbladder is within normal limits. Pancreas: Unremarkable. No pancreatic ductal dilatation or surrounding inflammatory changes. Spleen: Normal in size without focal abnormality. Adrenals/Urinary Tract: The left adrenal gland is within normal limits. The right adrenal gland appears of been surgically removed. Postsurgical changes adjacent to the right kidney are noted. Scattered nonobstructing renal calculi are noted bilaterally. Lobulation in left kidney is noted consistent with the prior surgical history. No obstructive changes are seen. Bladder is partially distended. Stomach/Bowel: Scattered diverticular change of the colon is noted. Appendix is well visualized and within normal limits. The stomach and small bowel are within normal limits. Vascular/Lymphatic: Aortic atherosclerosis. No enlarged abdominal or pelvic lymph nodes. Reproductive: Prostate is unremarkable. Other: No abdominal wall hernia or abnormality. No abdominopelvic ascites. Musculoskeletal: Degenerative changes of lumbar spine are noted. 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 Review of the MIP images confirms the above findings. IMPRESSION: CT of the chest: No evidence of pulmonary emboli. 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 CT of the abdomen and pelvis: Chronic changes without acute abnormality. 1 Electronically Signed   By: Inez Catalina M.D.   On: 05/12/2018 15:01     Labs:   Basic Metabolic Panel: Recent Labs  Lab 05/12/18 1205 05/12/18 2016 05/13/18 0212  NA 137  --  137  K 4.1  --  3.7  CL 106  --  104  CO2 22  --  23  GLUCOSE 102*  --  94  BUN 21*  --  29*  CREATININE 1.55* 1.59* 1.63*  CALCIUM 9.5  --  8.9  MG  --  1.9  --   PHOS  --  3.4  --    GFR Estimated Creatinine Clearance: 46.1 mL/min (A) (by C-G formula  based on SCr of 1.63 mg/dL (H)). Liver Function Tests: Recent Labs  Lab 05/12/18 1205  AST 18  ALT 19  ALKPHOS 60  BILITOT 0.9  PROT 7.2  ALBUMIN 4.1   No results for input(s): LIPASE, AMYLASE in the last 168 hours. No results for input(s): AMMONIA in the last 168 hours. Coagulation profile No results for input(s): INR, PROTIME in the last 168 hours.  CBC: Recent Labs  Lab 05/12/18 1205 05/12/18 2016 05/13/18 0212  WBC 6.9 8.3 6.6  NEUTROABS 4.0  --   --   HGB 15.7 15.0 14.3  HCT 48.1 43.9 43.4  MCV 85.4 84.9 86.3  PLT 289 279 259   Cardiac Enzymes: No results for input(s): CKTOTAL, CKMB, CKMBINDEX, TROPONINI in the last 168 hours. BNP: Invalid input(s): POCBNP CBG: No results for input(s): GLUCAP in the last 168 hours. D-Dimer Recent Labs    05/12/18 1205  DDIMER 0.36   Hgb A1c No results for input(s): HGBA1C in the last 72 hours. Lipid Profile No results for input(s): CHOL, HDL, LDLCALC, TRIG, CHOLHDL, LDLDIRECT in the last 72 hours. Thyroid function studies No results for input(s): TSH, T4TOTAL, T3FREE, THYROIDAB in the last 72 hours.  Invalid input(s): FREET3 Anemia work up No results for input(s): VITAMINB12, FOLATE, FERRITIN, TIBC, IRON, RETICCTPCT in the last 72 hours. Microbiology No results found for this or any previous visit (from the past 240 hour(s)).   Discharge Instructions:   Discharge Instructions    Diet - low sodium heart healthy   Complete by:  As directed    Increase activity slowly   Complete by:  As directed      Allergies as of 05/13/2018      Reactions   Toradol [ketorolac Tromethamine] Shortness Of Breath   Sulfa Antibiotics    Bells palsy       Medication List    STOP taking these medications   carvedilol 80 MG 24 hr capsule Commonly known as:  COREG CR   ibuprofen 200 MG tablet Commonly known as:  ADVIL,MOTRIN     TAKE these medications   amLODipine 10 MG tablet Commonly known as:  NORVASC Take 1 tablet  (10 mg total) by mouth daily. Start taking on:  May 14, 2018 What changed:    medication strength  how much to take   atorvastatin 40 MG tablet Commonly known as:  LIPITOR Take 1 tablet (40 mg total) by mouth daily.   carvedilol 25 MG tablet Commonly known as:  COREG Take 1 tablet (25 mg total) by mouth 2 (two) times daily with a meal.   cloNIDine 0.3 MG tablet Commonly known as:  CATAPRES Take 1 tablet (0.3 mg total) by mouth 3 (three) times daily.   lisinopril 20 MG tablet Commonly known as:  PRINIVIL,ZESTRIL Take 1 tablet (20 mg total) by mouth 2 (two) times daily.   methocarbamol 500 MG tablet Commonly known as:  ROBAXIN Take 1 tablet (500 mg total) by mouth every 8 (eight) hours as needed for muscle spasms.   tamsulosin 0.4 MG Caps capsule Commonly known as:  FLOMAX Take 1 capsule (0.4 mg total) by mouth daily.      Follow-up [place] Follow up.   Contact information: [address] [place] [address] [phone]           Time coordinating discharge: 25 min  Signed:  Geradine Girt DO  [facility] 05/13/2018, 11:36 AM

## 2018-06-30 ENCOUNTER — Emergency Department (HOSPITAL_COMMUNITY)
Admission: EM | Admit: 2018-06-30 | Discharge: 2018-06-30 | Disposition: A | Discharge status: Home / Self Care | Payer: Self-pay | Attending: Emergency Medicine | Admitting: Emergency Medicine

## 2018-06-30 ENCOUNTER — Other Ambulatory Visit: Payer: Self-pay

## 2018-06-30 ENCOUNTER — Emergency Department (HOSPITAL_COMMUNITY): Payer: Self-pay

## 2018-06-30 DIAGNOSIS — I10 Essential (primary) hypertension: Secondary | ICD-10-CM | POA: Insufficient documentation

## 2018-06-30 DIAGNOSIS — F1510 Other stimulant abuse, uncomplicated: Secondary | ICD-10-CM

## 2018-06-30 DIAGNOSIS — Z79899 Other long term (current) drug therapy: Secondary | ICD-10-CM | POA: Insufficient documentation

## 2018-06-30 DIAGNOSIS — F17210 Nicotine dependence, cigarettes, uncomplicated: Secondary | ICD-10-CM | POA: Insufficient documentation

## 2018-06-30 DIAGNOSIS — R079 Chest pain, unspecified: Secondary | ICD-10-CM | POA: Insufficient documentation

## 2018-06-30 DIAGNOSIS — F1520 Other stimulant dependence, uncomplicated: Secondary | ICD-10-CM | POA: Insufficient documentation

## 2018-06-30 LAB — BASIC METABOLIC PANEL
Anion gap: 13 (ref 5–15)
BUN: 24 mg/dL — ABNORMAL HIGH (ref 6–20)
CO2: 18 mmol/L — ABNORMAL LOW (ref 22–32)
Calcium: 9.5 mg/dL (ref 8.9–10.3)
Chloride: 102 mmol/L (ref 98–111)
Creatinine, Ser: 1.65 mg/dL — ABNORMAL HIGH (ref 0.61–1.24)
GFR calc Af Amer: 55 mL/min — ABNORMAL LOW (ref >60)
GFR calc non Af Amer: 47 mL/min — ABNORMAL LOW (ref >60)
Glucose, Bld: 107 mg/dL — ABNORMAL HIGH (ref 70–99)
Potassium: 4 mmol/L (ref 3.5–5.1)
SODIUM: 133 mmol/L — AB (ref 135–145)

## 2018-06-30 LAB — CBC
HCT: 51.2 % (ref 39.0–52.0)
Hemoglobin: 17.4 g/dL — ABNORMAL HIGH (ref 13.0–17.0)
MCH: 28.6 pg (ref 26.0–34.0)
MCHC: 34 g/dL (ref 30.0–36.0)
MCV: 84.2 fL (ref 80.0–100.0)
NRBC: 0 % (ref 0.0–0.2)
Platelets: 298 10*3/uL (ref 150–400)
RBC: 6.08 MIL/uL — ABNORMAL HIGH (ref 4.22–5.81)
RDW: 12.8 % (ref 11.5–15.5)
WBC: 10.8 10*3/uL — AB (ref 4.0–10.5)

## 2018-06-30 LAB — I-STAT TROPONIN, ED
Troponin i, poc: 0.05 ng/mL (ref 0.00–0.08)
Troponin i, poc: 0.07 ng/mL (ref 0.00–0.08)

## 2018-06-30 LAB — TROPONIN I: Troponin I: 0.04 ng/mL (ref <0.03)

## 2018-06-30 MED ORDER — AMLODIPINE BESYLATE 5 MG PO TABS
10.0000 mg | ORAL_TABLET | Freq: Every day | ORAL | Status: DC
  Administered 2018-06-30: 10 mg via ORAL
  Filled 2018-06-30: qty 2

## 2018-06-30 MED ORDER — CARVEDILOL 12.5 MG PO TABS
25.0000 mg | ORAL_TABLET | Freq: Two times a day (BID) | ORAL | Status: DC
  Administered 2018-06-30: 25 mg via ORAL
  Filled 2018-06-30: qty 2

## 2018-06-30 MED ORDER — MORPHINE SULFATE (PF) 4 MG/ML IV SOLN
4.0000 mg | Freq: Once | INTRAVENOUS | Status: AC
  Administered 2018-06-30: 4 mg via INTRAVENOUS
  Filled 2018-06-30: qty 1

## 2018-06-30 MED ORDER — LORAZEPAM 2 MG/ML IJ SOLN
1.0000 mg | Freq: Once | INTRAMUSCULAR | Status: AC
  Administered 2018-06-30: 1 mg via INTRAVENOUS
  Filled 2018-06-30: qty 1

## 2018-06-30 MED ORDER — CLONIDINE HCL 0.3 MG PO TABS: 0.3000 mg | ORAL_TABLET | Freq: Three times a day (TID) | ORAL | 0 refills | Status: DC

## 2018-06-30 MED ORDER — CLONIDINE HCL 0.2 MG PO TABS
0.3000 mg | ORAL_TABLET | Freq: Three times a day (TID) | ORAL | Status: DC
  Administered 2018-06-30: 0.3 mg via ORAL
  Filled 2018-06-30: qty 1

## 2018-06-30 MED ORDER — PROPOFOL 1000 MG/100ML IV EMUL: 5.0000 ug/kg/min | INTRAVENOUS | Status: DC

## 2018-06-30 MED ORDER — SODIUM CHLORIDE 0.9 % IV BOLUS
1000.0000 mL | Freq: Once | INTRAVENOUS | Status: AC
  Administered 2018-06-30: 1000 mL via INTRAVENOUS

## 2018-06-30 MED ORDER — LABETALOL HCL 5 MG/ML IV SOLN
10.0000 mg | Freq: Once | INTRAVENOUS | Status: AC
  Administered 2018-06-30: 10 mg via INTRAVENOUS
  Filled 2018-06-30: qty 4

## 2018-06-30 MED ORDER — LISINOPRIL 20 MG PO TABS
20.0000 mg | ORAL_TABLET | Freq: Two times a day (BID) | ORAL | Status: DC
  Administered 2018-06-30: 20 mg via ORAL
  Filled 2018-06-30: qty 1

## 2018-06-30 NOTE — ED Notes (Signed)
IV attempted x2. EKG completed.

## 2018-06-30 NOTE — ED Triage Notes (Signed)
Unable to obtain Iv access because of poor veins. Multiple RN's attempted . Iv team  Consult ordered.

## 2018-06-30 NOTE — ED Notes (Addendum)
Pt given opportunity for questions, all have been answered at this time. Pt endorses understanding of discharge paperwork and follow up. Pt ambulated to lobby with steady gait. Pt discharged by Rolene Arbour RN.

## 2018-06-30 NOTE — Discharge Instructions (Signed)
You were seen in the emergency department for chest pain in the setting of relapsing with methamphetamine.  You had chest x-ray EKG and lab tests here that did not show any evidence of any heart injury.  It will be important for you to take your regular medications and avoid using drugs.  Please follow-up with your doctor and return if any worsening symptoms.

## 2018-06-30 NOTE — ED Provider Notes (Signed)
[facility] EMERGENCY DEPARTMENT Provider Note   CSN: [id number] Arrival date & time: 06/30/18  1022    History   Chief Complaint Chief Complaint  Patient presents with  . Chest Pain    HPI Robert Shepherd is a 53 y.o. male.  He has a prior history of CABG.  He said he was using methamphetamine since last night and is here with few hours of severe substernal chest pain.  This associate with diaphoresis.  Shortness of breath.  No fevers chills cough.  He said he felt a little bit better after aspirin and nitro by EMS.     The history is provided by the patient.  Chest Pain  Pain location:  Substernal area Pain quality: stabbing   Pain radiates to:  Does not radiate Pain severity:  Severe Onset quality:  Gradual Timing:  Constant Progression:  Worsening Chronicity:  Recurrent Context: drug use   Relieved by:  Nothing Worsened by:  Nothing Ineffective treatments:  Aspirin and nitroglycerin Associated symptoms: diaphoresis   Associated symptoms: no abdominal pain, no cough, no fever, no headache and no shortness of breath     Past Medical History:  Diagnosis Date  . Cancer ([place])   . Hypertension   . Renal disorder     Patient Active Problem List   Diagnosis Date Noted  . Hypertensive urgency 05/12/2018    Past Surgical History:  Procedure Laterality Date  . CARDIAC SURGERY    . CYSTOSCOPY    . LITHOTRIPSY          Home Medications    Prior to Admission medications   Medication Sig Start Date End Date Taking? Authorizing Provider  amLODipine (NORVASC) 10 MG tablet Take 1 tablet (10 mg total) by mouth daily. 05/14/18   Geradine Girt, DO  atorvastatin (LIPITOR) 40 MG tablet Take 1 tablet (40 mg total) by mouth daily. 05/13/18   Geradine Girt, DO  carvedilol (COREG) 25 MG tablet Take 1 tablet (25 mg total) by mouth 2 (two) times daily with a meal. 05/13/18   Geradine Girt, DO  cloNIDine (CATAPRES) 0.3 MG tablet Take 1 tablet (0.3 mg total)  by mouth 3 (three) times daily. 05/13/18   Geradine Girt, DO  lisinopril (PRINIVIL,ZESTRIL) 20 MG tablet Take 1 tablet (20 mg total) by mouth 2 (two) times daily. 05/13/18   Geradine Girt, DO  methocarbamol (ROBAXIN) 500 MG tablet Take 1 tablet (500 mg total) by mouth every 8 (eight) hours as needed for muscle spasms. 05/13/18   Geradine Girt, DO  tamsulosin (FLOMAX) 0.4 MG CAPS capsule Take 1 capsule (0.4 mg total) by mouth daily. 05/13/18   Geradine Girt, DO    Family History No family history on file.  Social History Social History   Tobacco Use  . Smoking status: Current Every Day Smoker    Types: E-cigarettes  . Smokeless tobacco: Former Systems developer    Types: Chew    Quit date: 05/12/2008  Substance Use Topics  . Alcohol use: Not Currently  . Drug use: Not Currently     Allergies   Toradol [ketorolac tromethamine] and Sulfa antibiotics   Review of Systems Review of Systems  Constitutional: Positive for diaphoresis. Negative for fever.  HENT: Negative for sore throat.   Eyes: Negative for visual disturbance.  Respiratory: Negative for cough and shortness of breath.   Cardiovascular: Positive for chest pain.  Gastrointestinal: Negative for abdominal pain.  Genitourinary: Negative for dysuria.  Musculoskeletal: Negative for neck pain.  Skin: Negative for rash.  Neurological: Negative for headaches.     Physical Exam Updated Vital Signs BP (!) 143/119 (BP Location: Right Arm)   Pulse (!) 131   Temp 98.1 F (36.7 C)   Resp (!) 22   Ht 5\' 5"  (1.651 m)   Wt 68 kg   SpO2 98%   BMI 24.96 kg/m   Physical Exam Vitals signs and nursing note reviewed.  Constitutional:      Appearance: He is well-developed. He is diaphoretic.  HENT:     Head: Normocephalic and atraumatic.  Eyes:     Conjunctiva/sclera: Conjunctivae normal.  Neck:     Musculoskeletal: Neck supple.  Cardiovascular:     Rate and Rhythm: Regular rhythm. Tachycardia present.     Heart sounds: No  murmur.  Pulmonary:     Effort: Pulmonary effort is normal. No respiratory distress.     Breath sounds: Normal breath sounds.  Abdominal:     Palpations: Abdomen is soft.     Tenderness: There is no abdominal tenderness.  Musculoskeletal:     Right lower leg: He exhibits no tenderness. No edema.     Left lower leg: He exhibits no tenderness. No edema.  Skin:    General: Skin is warm.     Capillary Refill: Capillary refill takes less than 2 seconds.  Neurological:     General: No focal deficit present.     Mental Status: He is alert and oriented to person, place, and time.      ED Treatments / Results  Labs (all labs ordered are listed, but only abnormal results are displayed) Labs Reviewed  BASIC METABOLIC PANEL - Abnormal; Notable for the following components:      Result Value   Sodium 133 (*)    CO2 18 (*)    Glucose, Bld 107 (*)    BUN 24 (*)    Creatinine, Ser 1.65 (*)    GFR calc non Af Amer 47 (*)    GFR calc Af Amer 55 (*)    All other components within normal limits  CBC - Abnormal; Notable for the following components:   WBC 10.8 (*)    RBC 6.08 (*)    Hemoglobin 17.4 (*)    All other components within normal limits  TROPONIN I - Abnormal; Notable for the following components:   Troponin I 0.04 (*)    All other components within normal limits  I-STAT TROPONIN, ED    EKG EKG Interpretation  Date/Time:  Saturday June 30 2018 10:30:37 EDT Ventricular Rate:  125 PR Interval:    QRS Duration: 91 QT Interval:  303 QTC Calculation: 437 R Axis:   39 Text Interpretation:  Sinus tachycardia Probable left atrial enlargement LVH with secondary repolarization abnormality increased ischemia compared with prior 1/20 Confirmed by Aletta Edouard [phone]) on 06/30/2018 11:00:09 AM   Radiology Dg Chest Port 1 View  Result Date: 06/30/2018 CLINICAL DATA:  Flu like symptoms. Chest pain and shortness of breath. EXAM: PORTABLE CHEST 1 VIEW COMPARISON:  May 12, 2018  FINDINGS: The heart size and mediastinal contours are within normal limits. Both lungs are clear. The visualized skeletal structures are unremarkable. IMPRESSION: No active disease. Electronically Signed   By: Dorise Bullion III M.D   On: 06/30/2018 11:27    Procedures Procedures (including critical care time)  Medications Ordered in ED Medications  amLODipine (NORVASC) tablet 10 mg (10 mg Oral Given 06/30/18 1527)  carvedilol (  COREG) tablet 25 mg (25 mg Oral Given 06/30/18 1527)  cloNIDine (CATAPRES) tablet 0.3 mg (0.3 mg Oral Given 06/30/18 1528)  lisinopril (PRINIVIL,ZESTRIL) tablet 20 mg (20 mg Oral Given 06/30/18 1527)  morphine 4 MG/ML injection 4 mg (4 mg Intravenous Given 06/30/18 1244)  LORazepam (ATIVAN) injection 1 mg (1 mg Intravenous Given 06/30/18 1243)  sodium chloride 0.9 % bolus 1,000 mL (1,000 mLs Intravenous New Bag/Given 06/30/18 1243)  labetalol (NORMODYNE,TRANDATE) injection 10 mg (10 mg Intravenous Given 06/30/18 1658)     Initial Impression / Assessment and Plan / ED Course  I have reviewed the triage vital signs and the nursing notes.  Pertinent labs & imaging results that were available during my care of the patient were reviewed by me and considered in my medical decision making (see chart for details).  Clinical Course as of Jun 30 1715  Sat Jun 30, 2018  1332 Patient has very poor access secondary to his prior IV drug use.  He just received his morphine and Ativan.  Initial work-up is been fairly unremarkable, his creatinine of 1.65 is close to his baseline.  First Trope 0.05.  He will get a delta Trope.   [MB]  1348 Differential includes polysubstance abuse, dehydration, GERD, ACS, dissection, pneumothorax.  Chest x-ray unremarkable.  First troponin unremarkable.   [MB]  1350 He is still hypertensive and tachycardic here.  Will reorder his home blood pressure meds.   [MB]    Clinical Course User Index [MB] Hayden Rasmussen, MD        Final Clinical  Impressions(s) / ED Diagnoses   Final diagnoses:  Nonspecific chest pain  Methamphetamine abuse [facility])    ED Discharge Orders    None       Hayden Rasmussen, MD 06/30/18 1718

## 2018-07-01 ENCOUNTER — Encounter (HOSPITAL_COMMUNITY): Payer: Self-pay | Admitting: *Deleted

## 2018-07-01 ENCOUNTER — Emergency Department (HOSPITAL_COMMUNITY)
Admission: EM | Admit: 2018-07-01 | Discharge: 2018-07-02 | Disposition: A | Discharge status: Other Acute Inpatient Hospital | Payer: Self-pay | Attending: Emergency Medicine | Admitting: Emergency Medicine

## 2018-07-01 DIAGNOSIS — F1510 Other stimulant abuse, uncomplicated: Secondary | ICD-10-CM

## 2018-07-01 DIAGNOSIS — F1524 Other stimulant dependence with stimulant-induced mood disorder: Secondary | ICD-10-CM | POA: Insufficient documentation

## 2018-07-01 DIAGNOSIS — I10 Essential (primary) hypertension: Secondary | ICD-10-CM | POA: Insufficient documentation

## 2018-07-01 DIAGNOSIS — R05 Cough: Secondary | ICD-10-CM | POA: Insufficient documentation

## 2018-07-01 DIAGNOSIS — Y929 Unspecified place or not applicable: Secondary | ICD-10-CM | POA: Insufficient documentation

## 2018-07-01 DIAGNOSIS — Z008 Encounter for other general examination: Secondary | ICD-10-CM | POA: Insufficient documentation

## 2018-07-01 DIAGNOSIS — R45851 Suicidal ideations: Secondary | ICD-10-CM | POA: Insufficient documentation

## 2018-07-01 DIAGNOSIS — Z859 Personal history of malignant neoplasm, unspecified: Secondary | ICD-10-CM | POA: Insufficient documentation

## 2018-07-01 DIAGNOSIS — S61511A Laceration without foreign body of right wrist, initial encounter: Secondary | ICD-10-CM | POA: Insufficient documentation

## 2018-07-01 DIAGNOSIS — R059 Cough, unspecified: Secondary | ICD-10-CM

## 2018-07-01 DIAGNOSIS — F332 Major depressive disorder, recurrent severe without psychotic features: Secondary | ICD-10-CM

## 2018-07-01 DIAGNOSIS — X789XXA Intentional self-harm by unspecified sharp object, initial encounter: Secondary | ICD-10-CM | POA: Insufficient documentation

## 2018-07-01 DIAGNOSIS — Z79899 Other long term (current) drug therapy: Secondary | ICD-10-CM | POA: Insufficient documentation

## 2018-07-01 DIAGNOSIS — Z23 Encounter for immunization: Secondary | ICD-10-CM | POA: Insufficient documentation

## 2018-07-01 DIAGNOSIS — Y999 Unspecified external cause status: Secondary | ICD-10-CM | POA: Insufficient documentation

## 2018-07-01 DIAGNOSIS — F17210 Nicotine dependence, cigarettes, uncomplicated: Secondary | ICD-10-CM | POA: Insufficient documentation

## 2018-07-01 DIAGNOSIS — Y9389 Activity, other specified: Secondary | ICD-10-CM | POA: Insufficient documentation

## 2018-07-01 LAB — COMPREHENSIVE METABOLIC PANEL
ALT: 16 U/L (ref 0–44)
AST: 25 U/L (ref 15–41)
Albumin: 5.1 g/dL — ABNORMAL HIGH (ref 3.5–5.0)
Alkaline Phosphatase: 63 U/L (ref 38–126)
Anion gap: 11 (ref 5–15)
BUN: 45 mg/dL — ABNORMAL HIGH (ref 6–20)
CO2: 25 mmol/L (ref 22–32)
Calcium: 9.5 mg/dL (ref 8.9–10.3)
Chloride: 103 mmol/L (ref 98–111)
Creatinine, Ser: 2.02 mg/dL — ABNORMAL HIGH (ref 0.61–1.24)
GFR calc Af Amer: 43 mL/min — ABNORMAL LOW (ref >60)
GFR calc non Af Amer: 37 mL/min — ABNORMAL LOW (ref >60)
Glucose, Bld: 85 mg/dL (ref 70–99)
POTASSIUM: 3.6 mmol/L (ref 3.5–5.1)
Sodium: 139 mmol/L (ref 135–145)
Total Bilirubin: 0.9 mg/dL (ref 0.3–1.2)
Total Protein: 8.5 g/dL — ABNORMAL HIGH (ref 6.5–8.1)

## 2018-07-01 LAB — CBC
HCT: 51.5 % (ref 39.0–52.0)
Hemoglobin: 17 g/dL (ref 13.0–17.0)
MCH: 28.9 pg (ref 26.0–34.0)
MCHC: 33 g/dL (ref 30.0–36.0)
MCV: 87.4 fL (ref 80.0–100.0)
Platelets: 286 10*3/uL (ref 150–400)
RBC: 5.89 MIL/uL — ABNORMAL HIGH (ref 4.22–5.81)
RDW: 13.1 % (ref 11.5–15.5)
WBC: 8.1 10*3/uL (ref 4.0–10.5)
nRBC: 0 % (ref 0.0–0.2)

## 2018-07-01 LAB — RAPID URINE DRUG SCREEN, HOSP PERFORMED
Amphetamines: POSITIVE — AB
Barbiturates: NOT DETECTED
Benzodiazepines: NOT DETECTED
Cocaine: NOT DETECTED
Opiates: POSITIVE — AB
Tetrahydrocannabinol: NOT DETECTED

## 2018-07-01 LAB — ACETAMINOPHEN LEVEL: Acetaminophen (Tylenol), Serum: <10 ug/mL — ABNORMAL LOW (ref 10–30)

## 2018-07-01 LAB — SALICYLATE LEVEL: Salicylate Lvl: <7 mg/dL (ref 2.8–30.0)

## 2018-07-01 LAB — ETHANOL: Alcohol, Ethyl (B): <10 mg/dL (ref <10)

## 2018-07-01 MED ORDER — TAMSULOSIN HCL 0.4 MG PO CAPS
0.4000 mg | ORAL_CAPSULE | Freq: Every day | ORAL | Status: DC
  Administered 2018-07-01 – 2018-07-02 (×2): 0.4 mg via ORAL
  Filled 2018-07-01 (×2): qty 1

## 2018-07-01 MED ORDER — LORAZEPAM 1 MG PO TABS
1.0000 mg | ORAL_TABLET | Freq: Once | ORAL | Status: AC
  Administered 2018-07-01: 1 mg via ORAL
  Filled 2018-07-01: qty 1

## 2018-07-01 MED ORDER — ALBUTEROL SULFATE HFA 108 (90 BASE) MCG/ACT IN AERS: 2.0000 | INHALATION_SPRAY | Freq: Four times a day (QID) | RESPIRATORY_TRACT | Status: DC | PRN

## 2018-07-01 MED ORDER — LIDOCAINE-EPINEPHRINE (PF) 2 %-1:200000 IJ SOLN
10.0000 mL | Freq: Once | INTRAMUSCULAR | Status: AC
  Administered 2018-07-01: 10 mL
  Filled 2018-07-01: qty 20

## 2018-07-01 MED ORDER — ATORVASTATIN CALCIUM 40 MG PO TABS
40.0000 mg | ORAL_TABLET | Freq: Every day | ORAL | Status: DC
  Administered 2018-07-01: 40 mg via ORAL
  Filled 2018-07-01: qty 1

## 2018-07-01 MED ORDER — TETANUS-DIPHTH-ACELL PERTUSSIS 5-2.5-18.5 LF-MCG/0.5 IM SUSP
0.5000 mL | Freq: Once | INTRAMUSCULAR | Status: AC
  Administered 2018-07-01: 0.5 mL via INTRAMUSCULAR
  Filled 2018-07-01: qty 0.5

## 2018-07-01 MED ORDER — CARVEDILOL 25 MG PO TABS
25.0000 mg | ORAL_TABLET | Freq: Every day | ORAL | Status: DC
  Administered 2018-07-02: 25 mg via ORAL
  Filled 2018-07-01 (×2): qty 1

## 2018-07-01 MED ORDER — AMLODIPINE BESYLATE 5 MG PO TABS
10.0000 mg | ORAL_TABLET | Freq: Every day | ORAL | Status: DC
  Administered 2018-07-01 – 2018-07-02 (×2): 10 mg via ORAL
  Filled 2018-07-01 (×2): qty 2

## 2018-07-01 MED ORDER — LISINOPRIL 20 MG PO TABS
20.0000 mg | ORAL_TABLET | Freq: Every day | ORAL | Status: DC
  Administered 2018-07-01 – 2018-07-02 (×2): 20 mg via ORAL
  Filled 2018-07-01 (×2): qty 1

## 2018-07-01 MED ORDER — CLONIDINE HCL 0.1 MG PO TABS
0.3000 mg | ORAL_TABLET | Freq: Three times a day (TID) | ORAL | Status: DC
  Administered 2018-07-01 – 2018-07-02 (×3): 0.3 mg via ORAL
  Filled 2018-07-01 (×2): qty 3

## 2018-07-01 MED ORDER — ACETAMINOPHEN 325 MG PO TABS
650.0000 mg | ORAL_TABLET | Freq: Four times a day (QID) | ORAL | Status: DC | PRN
  Administered 2018-07-01: 650 mg via ORAL
  Filled 2018-07-01: qty 2

## 2018-07-01 NOTE — BH Assessment (Signed)
Tele Assessment Note   Patient Name: Robert Shepherd MRN: [id number] Referring Physician: Hayden Rasmussen, MD Location of Patient: [facility]  Location of Provider: [facility]  Robert Shepherd is an 53 y.o. male present [facility] alone with complaints of anxiety and suicidal intent of cutting his wrist. Patient report he has been suffering with depression for years. When asked what triggered your suicidal intent patient stated,"I just don't want to be hear anymore. Patient report currently he has been feeling suicidal for the past month. Patient did not provide detail of stressors present in his life. Report visual hallucinations of seeing people he knows. Patient denied homicidal ideations and denied auditory hallucinations. Patient presented with a depressed affect. Report he does not sleep, sleeping only 3 hours per night. Patient report decreased appetite. Patient report he has been off his medication past 6 months, 600mg  Seroquel. 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 Cancer ([place])   . Hypertension   . Renal disorder     Past Surgical History:  Procedure Laterality Date  . CARDIAC SURGERY    . CYSTOSCOPY    . LITHOTRIPSY      Family History: No family history on file.  Social History:  reports that he has been smoking e-cigarettes. He quit smokeless tobacco use about 10 years ago.  His smokeless tobacco use included chew. He reports previous alcohol use. He reports previous drug use.  Additional Social History:  Alcohol / Drug Use Pain Medications: see MAR Prescriptions: see MAR Over the Counter: see MAR History of alcohol / drug use?: Yes Substance #1 Name of Substance 1: Meth 1 - Age of First Use: 24 1 - Amount  (size/oz): gram  1 - Frequency: daily  1 - Duration: varies  1 - Last Use / Amount: 06/30/2018  CIWA: CIWA-Ar BP: (!) 146/111(Resp 17) Pulse Rate: 91(Resp 17) COWS:    Allergies:  Allergies  Allergen Reactions  . Toradol [Ketorolac Tromethamine] Shortness Of Breath  . Sulfa Antibiotics Other (See Comments)    Caused Bells palsy     Home Medications: (Not in a hospital admission)   OB/GYN Status:  No LMP for male patient.  General Assessment Data Assessment unable to be completed: Yes(patient getting stitches ) Reason for not completing assessment: pt getting stitches [facility]: [facility] TTS Assessment: In system Is this a Tele or Face-to-Face Assessment?: Face-to-Face Is this an Initial Assessment or a Re-assessment for this encounter?: Initial Assessment Patient Accompanied by:: N/A Language Other than English: No Living Arrangements: Homeless/Shelter What gender do you identify as?: Male Living Arrangements: Alone Can pt return to current living arrangement?: Yes Admission Status: Voluntary Is patient capable of signing voluntary admission?: No Referral Source: Self/Family/Friend     Crisis Care Plan Living Arrangements: Alone Name of Psychiatrist: n/a Name of Therapist: n/a  Education Status Is patient currently in school?: No Is the patient employed, unemployed or receiving disability?: Unemployed  Risk to self with the past 6 months Suicidal Ideation: Yes-Currently Present Has patient been a risk to self within the past 6 months prior to admission? : Yes(report been suicidal past 7 months ) Suicidal Intent: Yes-Currently Present(patient attempted to cut wrist ) Has patient had any suicidal intent within the past 6 months prior  to admission? : No Is patient at risk for suicide?: Yes Suicidal Plan?: Yes-Currently Present Has patient had any suicidal plan within the past 6 months prior to admission? : No Specify Current Suicidal Plan: cut wrist   Access to Means: Yes Specify Access to Suicidal Means: pocket knife  What has been your use of drugs/alcohol within the last 12 months?: Meth Previous Attempts/Gestures: No How many times?: 2 Other Self Harm Risks: none reported  Triggers for Past Attempts: Other (Comment)(depression ) Intentional Self Injurious Behavior: Damaging(substance use ) Comment - Self Injurious Behavior: substance use - Meth  Family Suicide History: Yes(uncle and nephew ) Recent stressful life event(s): Other (Comment)(depression ) Persecutory voices/beliefs?: No Depression: Yes Depression Symptoms: Insomnia, Tearfulness, Feeling worthless/self pity, Loss of interest in usual pleasures, Feeling angry/irritable Substance abuse history and/or treatment for substance abuse?: No Suicide prevention information given to non-admitted patients: Not applicable  Risk to Others within the past 6 months Homicidal Ideation: No Does patient have any lifetime risk of violence toward others beyond the six months prior to admission? : No Thoughts of Harm to Others: No Current Homicidal Intent: No Current Homicidal Plan: No Access to Homicidal Means: No Identified Victim: n/a  History of harm to others?: No Assessment of Violence: None Noted Violent Behavior Description: None Noted  Does patient have access to weapons?: No Criminal Charges Pending?: No Does patient have a court date: No Is patient on probation?: No  Psychosis Hallucinations: Visual(report seeing people ) Delusions: None noted  Mental Status Report Appearance/Hygiene: In scrubs Eye Contact: Good Motor Activity: Freedom of movement Speech: Logical/coherent Level of Consciousness: Alert Mood: Depressed Affect: Depressed Anxiety Level: None Thought Processes: Coherent, Relevant Judgement: Impaired Orientation: Person, Place, Time, Situation Obsessive Compulsive Thoughts/Behaviors: None  Cognitive Functioning Concentration: Normal Memory:  Recent Intact, Remote Intact Is patient IDD: No Insight: Poor Impulse Control: Poor Appetite: Poor Have you had any weight changes? : No Change Sleep: Decreased(3 hours ) Total Hours of Sleep: 3(client sleeps 3 hours ) Vegetative Symptoms: None  ADLScreening [facility] Assessment Services) Patient's cognitive ability adequate to safely complete daily activities?: Yes Patient able to express need for assistance with ADLs?: Yes Independently performs ADLs?: Yes (appropriate for developmental age)  Prior Inpatient Therapy Prior Inpatient Therapy: Yes Prior Therapy Dates: 2019 Reason for Treatment: Depression   Prior Outpatient Therapy Prior Outpatient Therapy: No Does patient have an ACCT team?: No Does patient have Intensive In-House Services?  : No Does patient have [facility] services? : No Does patient have P4CC services?: No  ADL Screening (condition at time of admission) Patient's cognitive ability adequate to safely complete daily activities?: Yes Is the patient deaf or have difficulty hearing?: No Does the patient have difficulty seeing, even when wearing glasses/contacts?: No Does the patient have difficulty concentrating, remembering, or making decisions?: No Patient able to express need for assistance with ADLs?: Yes Does the patient have difficulty dressing or bathing?: No Independently performs ADLs?: Yes (appropriate for developmental age) Does the patient have difficulty walking or climbing stairs?: No       Abuse/Neglect Assessment (Assessment to be complete while patient is alone) Abuse/Neglect Assessment Can Be Completed: Yes Physical Abuse: Denies Verbal Abuse: Denies Sexual Abuse: Denies Exploitation of patient/patient's resources: Denies     Regulatory affairs officer (For Healthcare) Does Patient Have a Medical Advance Directive?: No Would patient like information on creating a medical advance directive?: No - Patient declined          Disposition:   Disposition  Initial Assessment Completed for this Encounter: Yes(Laurie Parks,NP, inpt treatment )   Robert Shepherd 07/01/2018 3:16 PM

## 2018-07-01 NOTE — ED Notes (Signed)
Bed: [facility] Expected date:  Expected time:  Means of arrival:  Comments: 

## 2018-07-01 NOTE — ED Notes (Signed)
Pt has been seen and wand by security.

## 2018-07-01 NOTE — ED Triage Notes (Signed)
Pt states is having anxiety and is going to hurt himself. Pt states he has felt depressed for the past month. Pt states he would OD or jump in front of a car.

## 2018-07-01 NOTE — ED Notes (Signed)
Pt blue backpack placed in locker 29 in [place]. PT knife was given to security to secure in office

## 2018-07-01 NOTE — BHH Counselor (Signed)
Patient unable to be seen at this time. Patient is receiving stitches. TTS will see patient later this day

## 2018-07-01 NOTE — ED Notes (Signed)
Transferred from TCU to room 43 for ongoing assessment and safety monitoring. He cut his wrists while in the ED stating he didn't want to live. He states he relapsed recently after living for months in a sober living house. He states relapsing and other things are the reason he is feeling so sad and despairing. He had home meds with him that were sent to the pharmacy while here on unit. He had RX from [facility], [place], [place] and [facility] [place]. He is cooperative with the admission process. He was given a snack once on the unit. He is able to contract for safety at this time but endorses still feeling SI and sad. He complained of back pain of a 7 and was given Tylenol as ordered for his complaint. Good eye contact, sad affect, pleasant.

## 2018-07-01 NOTE — ED Notes (Signed)
This nurse left room to get scrubs for the patient. The patient voiced understanding and was cooperative at this time. Upon returning, the patient had a pocket knife that he had cut his wrist with. When asked to hand over the knife, the patient cut his wrist again with the pocket knife, stating "I don't want to live". The patient then put down the pocket knife and this nurse confiscated knife. Pressure was placed on knife wound and patient was transported back to a treatment room.

## 2018-07-01 NOTE — ED Provider Notes (Signed)
[facility] DEPT Provider Note   CSN: [id number] Arrival date & time: 07/01/18  1320    History   Chief Complaint Chief Complaint  Patient presents with  . Suicidal    HPI Robert Shepherd is a 53 y.o. male.  He was just seen by me yesterday at [facility] for chest pain in the setting of methamphetamine use.  Ultimately he ruled out and was discharged.  He is here today complaining of ongoing suicidal thoughts and a self-inflicted laceration to his right wrist.  He said he is overdosed before.  He has a history of depression and borderline schizophrenia and he is untreated at the current time.  He denies any ingestions.     The history is provided by the patient.  Mental Health Problem  Presenting symptoms: depression, suicidal thoughts and suicide attempt   Degree of incapacity (severity):  Unable to specify Onset quality:  Gradual Timing:  Intermittent Progression:  Worsening Chronicity:  Recurrent Context: drug abuse and noncompliance   Treatment compliance:  Untreated Relieved by:  Nothing Worsened by:  Drugs Associated symptoms: no abdominal pain, no chest pain and no headaches     Past Medical History:  Diagnosis Date  . Cancer ([place])   . Hypertension   . Renal disorder     Patient Active Problem List   Diagnosis Date Noted  . Hypertensive urgency 05/12/2018    Past Surgical History:  Procedure Laterality Date  . CARDIAC SURGERY    . CYSTOSCOPY    . LITHOTRIPSY          Home Medications    Prior to Admission medications   Medication Sig Start Date End Date Taking? Authorizing Provider  acetaminophen (TYLENOL) 325 MG tablet Take 650 mg by mouth every 6 (six) hours as needed for headache (pain).    [provider]  albuterol (PROVENTIL HFA;VENTOLIN HFA) 108 (90 Base) MCG/ACT inhaler Inhale 2 puffs into the lungs every 6 (six) hours as needed for wheezing or shortness of breath.    [provider]  amLODipine  (NORVASC) 10 MG tablet Take 1 tablet (10 mg total) by mouth daily. 05/14/18   Geradine Girt, DO  atorvastatin (LIPITOR) 40 MG tablet Take 1 tablet (40 mg total) by mouth daily. 05/13/18   Geradine Girt, DO  carvedilol (COREG) 25 MG tablet Take 1 tablet (25 mg total) by mouth 2 (two) times daily with a meal. Patient taking differently: Take 25 mg by mouth daily.  05/13/18   Geradine Girt, DO  cloNIDine (CATAPRES) 0.3 MG tablet Take 1 tablet (0.3 mg total) by mouth 3 (three) times daily for 30 days. 06/30/18 07/30/18  Little, Wenda Overland, MD  ibuprofen (ADVIL,MOTRIN) 200 MG tablet Take 400 mg by mouth every 6 (six) hours as needed for headache (pain).    [provider]  lisinopril (PRINIVIL,ZESTRIL) 20 MG tablet Take 1 tablet (20 mg total) by mouth 2 (two) times daily. Patient not taking: Reported on 06/30/2018 05/13/18   Geradine Girt, DO  methocarbamol (ROBAXIN) 500 MG tablet Take 1 tablet (500 mg total) by mouth every 8 (eight) hours as needed for muscle spasms. 05/13/18   Geradine Girt, DO  tamsulosin (FLOMAX) 0.4 MG CAPS capsule Take 1 capsule (0.4 mg total) by mouth daily. 05/13/18   Geradine Girt, DO    Family History No family history on file.  Social History Social History   Tobacco Use  . Smoking status: Current Every Day Smoker  Types: E-cigarettes  . Smokeless tobacco: Former Systems developer    Types: Chew    Quit date: 05/12/2008  Substance Use Topics  . Alcohol use: Not Currently  . Drug use: Not Currently     Allergies   Toradol [ketorolac tromethamine] and Sulfa antibiotics   Review of Systems Review of Systems  Constitutional: Negative for fever.  HENT: Negative for sore throat.   Eyes: Negative for visual disturbance.  Respiratory: Negative for shortness of breath.   Cardiovascular: Negative for chest pain.  Gastrointestinal: Negative for abdominal pain.  Genitourinary: Negative for dysuria.  Musculoskeletal: Negative for neck pain.  Skin: Positive for  wound. Negative for rash.  Neurological: Negative for headaches.  Psychiatric/Behavioral: Positive for suicidal ideas.     Physical Exam Updated Vital Signs BP (!) 158/113 (BP Location: Left Arm)   Pulse 90   Temp 98.4 F (36.9 C) (Oral)   Resp 16   SpO2 98%   Physical Exam Vitals signs and nursing note reviewed.  Constitutional:      Appearance: He is well-developed.  HENT:     Head: Normocephalic and atraumatic.  Eyes:     Conjunctiva/sclera: Conjunctivae normal.  Neck:     Musculoskeletal: Neck supple.  Cardiovascular:     Rate and Rhythm: Normal rate and regular rhythm.     Heart sounds: No murmur.  Pulmonary:     Effort: Pulmonary effort is normal. No respiratory distress.     Breath sounds: Normal breath sounds.  Abdominal:     Palpations: Abdomen is soft.     Tenderness: There is no abdominal tenderness.  Musculoskeletal: Normal range of motion.        General: Signs of injury present.     Comments: 4cm superficial laceration right volar wrist with some bleeding, nonpulsatile. Fds, fdp, intact. Radial, ulnar and medial nerve motor and sensory intact. Radial pulse 2+. Cap refil brisk.   Skin:    General: Skin is warm and dry.     Capillary Refill: Capillary refill takes less than 2 seconds.  Neurological:     General: No focal deficit present.     Mental Status: He is alert and oriented to person, place, and time.     Sensory: No sensory deficit.     Motor: No weakness.     Gait: Gait normal.      ED Treatments / Results  Labs (all labs ordered are listed, but only abnormal results are displayed) Labs Reviewed  COMPREHENSIVE METABOLIC PANEL - Abnormal; Notable for the following components:      Result Value   BUN 45 (*)    Creatinine, Ser 2.02 (*)    Total Protein 8.5 (*)    Albumin 5.1 (*)    GFR calc non Af Amer 37 (*)    GFR calc Af Amer 43 (*)    All other components within normal limits  ACETAMINOPHEN LEVEL - Abnormal; Notable for the  following components:   Acetaminophen (Tylenol), Serum <10 (*)    All other components within normal limits  CBC - Abnormal; Notable for the following components:   RBC 5.89 (*)    All other components within normal limits  RAPID URINE DRUG SCREEN, HOSP PERFORMED - Abnormal; Notable for the following components:   Opiates POSITIVE (*)    Amphetamines POSITIVE (*)    All other components within normal limits  ETHANOL  SALICYLATE LEVEL    EKG None  Radiology Dg Chest Port 1 View  Result Date: 06/30/2018  CLINICAL DATA:  Flu like symptoms. Chest pain and shortness of breath. EXAM: PORTABLE CHEST 1 VIEW COMPARISON:  May 12, 2018 FINDINGS: The heart size and mediastinal contours are within normal limits. Both lungs are clear. The visualized skeletal structures are unremarkable. IMPRESSION: No active disease. Electronically Signed   By: Dorise Bullion III M.D   On: 06/30/2018 11:27    Procedures .Marland KitchenLaceration Repair Date/Time: 07/01/2018 2:30 PM Performed by: Hayden Rasmussen, MD Authorized by: Hayden Rasmussen, MD   Consent:    Consent obtained:  Verbal   Consent given by:  Patient   Risks discussed:  Infection, pain, poor cosmetic result, poor wound healing and retained foreign body   Alternatives discussed:  No treatment and delayed treatment Anesthesia (see MAR for exact dosages):    Anesthesia method:  Local infiltration   Local anesthetic:  Lidocaine 2% WITH epi Laceration details:    Location:  Shoulder/arm   Shoulder/arm location:  R lower arm   Length (cm):  4 Repair type:    Repair type:  Simple Pre-procedure details:    Preparation:  Patient was prepped and draped in usual sterile fashion Exploration:    Wound exploration: wound explored through full range of motion   Treatment:    Area cleansed with:  Saline   Amount of cleaning:  Standard   Irrigation solution:  Sterile saline   Irrigation method:  Syringe Skin repair:    Repair method:  Sutures    Suture size:  4-0   Suture material:  Nylon   Suture technique:  Simple interrupted   Number of sutures:  4 Post-procedure details:    Dressing:  Bulky dressing   Patient tolerance of procedure:  Tolerated well, no immediate complications   (including critical care time)  Medications Ordered in ED Medications  lidocaine-EPINEPHrine (XYLOCAINE W/EPI) 2 %-1:200000 (PF) injection 10 mL (has no administration in time range)  Tdap (BOOSTRIX) injection 0.5 mL (has no administration in time range)     Initial Impression / Assessment and Plan / ED Course  I have reviewed the triage vital signs and the nursing notes.  Pertinent labs & imaging results that were available during my care of the patient were reviewed by me and considered in my medical decision making (see chart for details).  Clinical Course as of Jul 02 639  Sun Jul 01, 2018  1529 Patient is voluntary here for evaluation of suicidal ideation and self-inflicted laceration.  His laceration has been closed.  He will need the sutures taken out and about 14 days.  His medical screening evaluation labs were unremarkable other than a slightly increased creatinine of 2.0.  He is taking p.o.  His blood pressure is been high here although it sounds like he is inconsistently taking his medications for his hypertension.  I reordered his home medications.  He will be evaluated by behavioral health for disposition.   [MB]    Clinical Course User Index [MB] Hayden Rasmussen, MD        Final Clinical Impressions(s) / ED Diagnoses   Final diagnoses:  Cough  Suicidal thoughts  Laceration of right wrist, initial encounter    ED Discharge Orders    None       Hayden Rasmussen, MD 07/02/18 [phone]

## 2018-07-01 NOTE — ED Notes (Signed)
Laceration to rt wrist with pressure dressing to control bleeding.

## 2018-07-02 ENCOUNTER — Other Ambulatory Visit: Payer: Self-pay

## 2018-07-02 ENCOUNTER — Inpatient Hospital Stay (HOSPITAL_COMMUNITY)
Admission: AD | Admit: 2018-07-02 | Discharge: 2018-07-12 | DRG: 885 | Disposition: A | Discharge status: Home / Self Care | Payer: Federal, State, Local not specified - Other | Source: Intra-hospital | Attending: Emergency Medicine | Admitting: Emergency Medicine

## 2018-07-02 ENCOUNTER — Emergency Department (HOSPITAL_COMMUNITY): Payer: Self-pay

## 2018-07-02 ENCOUNTER — Encounter (HOSPITAL_COMMUNITY): Payer: Self-pay | Admitting: *Deleted

## 2018-07-02 DIAGNOSIS — Z79899 Other long term (current) drug therapy: Secondary | ICD-10-CM | POA: Diagnosis not present

## 2018-07-02 DIAGNOSIS — E7800 Pure hypercholesterolemia, unspecified: Secondary | ICD-10-CM | POA: Diagnosis present

## 2018-07-02 DIAGNOSIS — F940 Selective mutism: Secondary | ICD-10-CM | POA: Diagnosis present

## 2018-07-02 DIAGNOSIS — F1520 Other stimulant dependence, uncomplicated: Secondary | ICD-10-CM | POA: Diagnosis not present

## 2018-07-02 DIAGNOSIS — N400 Enlarged prostate without lower urinary tract symptoms: Secondary | ICD-10-CM | POA: Diagnosis present

## 2018-07-02 DIAGNOSIS — F1594 Other stimulant use, unspecified with stimulant-induced mood disorder: Secondary | ICD-10-CM | POA: Diagnosis not present

## 2018-07-02 DIAGNOSIS — F419 Anxiety disorder, unspecified: Secondary | ICD-10-CM | POA: Diagnosis present

## 2018-07-02 DIAGNOSIS — F332 Major depressive disorder, recurrent severe without psychotic features: Secondary | ICD-10-CM | POA: Diagnosis present

## 2018-07-02 DIAGNOSIS — E7220 Disorder of urea cycle metabolism, unspecified: Secondary | ICD-10-CM | POA: Diagnosis present

## 2018-07-02 DIAGNOSIS — F1524 Other stimulant dependence with stimulant-induced mood disorder: Secondary | ICD-10-CM | POA: Diagnosis present

## 2018-07-02 DIAGNOSIS — E785 Hyperlipidemia, unspecified: Secondary | ICD-10-CM | POA: Diagnosis present

## 2018-07-02 DIAGNOSIS — N183 Chronic kidney disease, stage 3 (moderate): Secondary | ICD-10-CM | POA: Diagnosis present

## 2018-07-02 DIAGNOSIS — G4700 Insomnia, unspecified: Secondary | ICD-10-CM | POA: Diagnosis present

## 2018-07-02 DIAGNOSIS — I130 Hypertensive heart and chronic kidney disease with heart failure and stage 1 through stage 4 chronic kidney disease, or unspecified chronic kidney disease: Secondary | ICD-10-CM | POA: Diagnosis present

## 2018-07-02 DIAGNOSIS — J449 Chronic obstructive pulmonary disease, unspecified: Secondary | ICD-10-CM | POA: Diagnosis present

## 2018-07-02 DIAGNOSIS — K769 Liver disease, unspecified: Secondary | ICD-10-CM | POA: Diagnosis present

## 2018-07-02 DIAGNOSIS — F15251 Other stimulant dependence with stimulant-induced psychotic disorder with hallucinations: Secondary | ICD-10-CM | POA: Diagnosis present

## 2018-07-02 DIAGNOSIS — R51 Headache: Secondary | ICD-10-CM | POA: Diagnosis present

## 2018-07-02 DIAGNOSIS — F17221 Nicotine dependence, chewing tobacco, in remission: Secondary | ICD-10-CM | POA: Diagnosis present

## 2018-07-02 DIAGNOSIS — I951 Orthostatic hypotension: Secondary | ICD-10-CM | POA: Diagnosis not present

## 2018-07-02 DIAGNOSIS — F15951 Other stimulant use, unspecified with stimulant-induced psychotic disorder with hallucinations: Secondary | ICD-10-CM | POA: Diagnosis not present

## 2018-07-02 DIAGNOSIS — I2510 Atherosclerotic heart disease of native coronary artery without angina pectoris: Secondary | ICD-10-CM | POA: Diagnosis present

## 2018-07-02 DIAGNOSIS — Z882 Allergy status to sulfonamides status: Secondary | ICD-10-CM

## 2018-07-02 DIAGNOSIS — F0630 Mood disorder due to known physiological condition, unspecified: Secondary | ICD-10-CM

## 2018-07-02 DIAGNOSIS — R400 Somnolence: Secondary | ICD-10-CM | POA: Diagnosis present

## 2018-07-02 DIAGNOSIS — F17290 Nicotine dependence, other tobacco product, uncomplicated: Secondary | ICD-10-CM | POA: Diagnosis present

## 2018-07-02 DIAGNOSIS — I509 Heart failure, unspecified: Secondary | ICD-10-CM | POA: Diagnosis present

## 2018-07-02 DIAGNOSIS — R45851 Suicidal ideations: Secondary | ICD-10-CM | POA: Diagnosis present

## 2018-07-02 DIAGNOSIS — Z951 Presence of aortocoronary bypass graft: Secondary | ICD-10-CM

## 2018-07-02 DIAGNOSIS — Z885 Allergy status to narcotic agent status: Secondary | ICD-10-CM

## 2018-07-02 DIAGNOSIS — Z23 Encounter for immunization: Secondary | ICD-10-CM

## 2018-07-02 DIAGNOSIS — F1510 Other stimulant abuse, uncomplicated: Secondary | ICD-10-CM | POA: Diagnosis present

## 2018-07-02 MED ORDER — QUETIAPINE FUMARATE 100 MG PO TABS: 100.0000 mg | ORAL_TABLET | Freq: Every day | ORAL | Status: DC

## 2018-07-02 MED ORDER — MAGNESIUM HYDROXIDE 400 MG/5ML PO SUSP: 30.0000 mL | Freq: Every day | ORAL | Status: DC | PRN

## 2018-07-02 MED ORDER — ACETAMINOPHEN 325 MG PO TABS
650.0000 mg | ORAL_TABLET | Freq: Four times a day (QID) | ORAL | Status: DC | PRN
  Administered 2018-07-09: 650 mg via ORAL
  Filled 2018-07-02: qty 2

## 2018-07-02 MED ORDER — CLONIDINE HCL 0.3 MG PO TABS
0.3000 mg | ORAL_TABLET | Freq: Three times a day (TID) | ORAL | Status: DC
  Administered 2018-07-02 – 2018-07-07 (×11): 0.3 mg via ORAL
  Filled 2018-07-02 (×14): qty 1
  Filled 2018-07-02: qty 3
  Filled 2018-07-02 (×2): qty 1
  Filled 2018-07-02: qty 3
  Filled 2018-07-02 (×7): qty 1

## 2018-07-02 MED ORDER — ALBUTEROL SULFATE HFA 108 (90 BASE) MCG/ACT IN AERS
2.0000 | INHALATION_SPRAY | Freq: Four times a day (QID) | RESPIRATORY_TRACT | Status: DC | PRN
  Filled 2018-07-02: qty 6.7

## 2018-07-02 MED ORDER — NICOTINE 21 MG/24HR TD PT24
21.0000 mg | MEDICATED_PATCH | Freq: Every day | TRANSDERMAL | Status: DC
  Administered 2018-07-02 – 2018-07-11 (×9): 21 mg via TRANSDERMAL
  Filled 2018-07-02: qty 1
  Filled 2018-07-02: qty 14
  Filled 2018-07-02 (×10): qty 1
  Filled 2018-07-02: qty 14
  Filled 2018-07-02 (×3): qty 1

## 2018-07-02 MED ORDER — ALUM & MAG HYDROXIDE-SIMETH 200-200-20 MG/5ML PO SUSP: 30.0000 mL | ORAL | Status: DC | PRN

## 2018-07-02 MED ORDER — QUETIAPINE FUMARATE 100 MG PO TABS
100.0000 mg | ORAL_TABLET | Freq: Every day | ORAL | Status: DC
  Administered 2018-07-02: 100 mg via ORAL
  Filled 2018-07-02 (×3): qty 1

## 2018-07-02 MED ORDER — LISINOPRIL 20 MG PO TABS
20.0000 mg | ORAL_TABLET | Freq: Every day | ORAL | Status: DC
  Administered 2018-07-03 – 2018-07-08 (×6): 20 mg via ORAL
  Filled 2018-07-02: qty 14
  Filled 2018-07-02 (×5): qty 1
  Filled 2018-07-02: qty 14
  Filled 2018-07-02 (×2): qty 1

## 2018-07-02 MED ORDER — AMLODIPINE BESYLATE 10 MG PO TABS
10.0000 mg | ORAL_TABLET | Freq: Every day | ORAL | Status: DC
  Administered 2018-07-03 – 2018-07-11 (×9): 10 mg via ORAL
  Filled 2018-07-02 (×3): qty 1
  Filled 2018-07-02: qty 14
  Filled 2018-07-02 (×3): qty 1
  Filled 2018-07-02: qty 14
  Filled 2018-07-02 (×5): qty 1

## 2018-07-02 MED ORDER — ATORVASTATIN CALCIUM 40 MG PO TABS
40.0000 mg | ORAL_TABLET | Freq: Every day | ORAL | Status: DC
  Administered 2018-07-02 – 2018-07-11 (×10): 40 mg via ORAL
  Filled 2018-07-02 (×10): qty 1
  Filled 2018-07-02: qty 14
  Filled 2018-07-02 (×3): qty 1
  Filled 2018-07-02: qty 14
  Filled 2018-07-02: qty 1

## 2018-07-02 MED ORDER — INFLUENZA VAC SPLIT QUAD 0.5 ML IM SUSY
0.5000 mL | PREFILLED_SYRINGE | INTRAMUSCULAR | Status: AC
  Administered 2018-07-03: 0.5 mL via INTRAMUSCULAR
  Filled 2018-07-02: qty 0.5

## 2018-07-02 MED ORDER — BENZONATATE 100 MG PO CAPS
100.0000 mg | ORAL_CAPSULE | Freq: Once | ORAL | Status: AC
  Administered 2018-07-02: 100 mg via ORAL
  Filled 2018-07-02: qty 1

## 2018-07-02 MED ORDER — TAMSULOSIN HCL 0.4 MG PO CAPS
0.4000 mg | ORAL_CAPSULE | Freq: Every day | ORAL | Status: DC
  Administered 2018-07-03 – 2018-07-11 (×9): 0.4 mg via ORAL
  Filled 2018-07-02: qty 1
  Filled 2018-07-02: qty 14
  Filled 2018-07-02 (×9): qty 1
  Filled 2018-07-02: qty 14
  Filled 2018-07-02: qty 1

## 2018-07-02 MED ORDER — CARVEDILOL 25 MG PO TABS
25.0000 mg | ORAL_TABLET | Freq: Every day | ORAL | Status: DC
  Administered 2018-07-03 – 2018-07-11 (×9): 25 mg via ORAL
  Filled 2018-07-02 (×3): qty 1
  Filled 2018-07-02: qty 28
  Filled 2018-07-02 (×5): qty 1
  Filled 2018-07-02: qty 28
  Filled 2018-07-02 (×3): qty 1

## 2018-07-02 NOTE — Progress Notes (Signed)
Patient ID: Robert Shepherd, male   DOB: [birth year], 53 y.o.   MRN: [id number] D: Patient in room on approach. Pt mood and affect appeared depressed and flat. Pt reports he is happy to be back on his medications. Pt denies SI/HI/AVH and pain. Pt attended and participated in evening wrap up group and engaged in discussion. Cooperative with assessment. No acute distressed noted at this time.   A: Medications administered as prescribed. Support and encouragement provided to attend groups and engage in milieu. Pt encouraged to discuss feelings and come to staff with any question or concerns.   R: Patient remains safe and complaint with medications.

## 2018-07-02 NOTE — ED Notes (Signed)
Coughing loudly. He does not have a fever, states he has had the cough for about three weeks and is ready to get rid of it. He reports its a productive cough and sputum is clear. Dr notified for something to suppress the cough. He ordered a chest Xray.

## 2018-07-02 NOTE — ED Notes (Signed)
[facility] TEAM Provider at bedside.

## 2018-07-02 NOTE — ED Notes (Signed)
ED TO INPATIENT HANDOFF REPORT  ED Nurse Shepherd and Phone #:     Robert Shepherd/Age/Gender Robert Shepherd 53 y.o. male Room/Bed: YFV49/SWH67  Code Status   Code Status: Full Code  Home/SNF/Other Home Patient oriented to: self, place, time and situation Is this baseline? Yes   Triage Complete: Triage complete  Chief Complaint not feeling right in the head  Triage Note Pt states is having anxiety and is going to hurt himself. Pt states he has felt depressed for the past month. Pt states he would OD or jump in front of a car.    Allergies Allergies  Allergen Reactions  . Toradol [Ketorolac Tromethamine] Shortness Of Breath  . Sulfa Antibiotics Other (See Comments)    Caused Bells palsy     Level of Care/Admitting Diagnosis ED Disposition    ED Disposition Condition Comment   Transfer to Another Facility  Transfer to [facility] 304-02      B Medical/Surgery History Past Medical History:  Diagnosis Date  . Cancer ([place])   . Hypertension   . Renal disorder    Past Surgical History:  Procedure Laterality Date  . CARDIAC SURGERY    . CYSTOSCOPY    . LITHOTRIPSY       A IV Location/Drains/Wounds Patient Lines/Drains/Airways Status   Active Line/Drains/Airways    Shepherd:   Placement date:   Placement time:   Site:   Days:   Wound / Incision (Open or Dehisced) 07/02/18 Laceration Wrist Right 1/2 SELF INFLICTED   59/16/38    1404    Wrist   less than 1          Intake/Output Last 24 hours No intake or output data in the 24 hours ending 07/02/18 1406  Labs/Imaging Results for orders placed or performed during the hospital encounter of 07/01/18 (from the past 48 hour(Robert))  Rapid urine drug screen (hospital performed)     Status: Abnormal   Collection Time: 07/01/18  1:32 PM  Result Value Ref Range   Opiates POSITIVE (A) NONE DETECTED   Cocaine NONE DETECTED NONE DETECTED   Benzodiazepines NONE DETECTED NONE DETECTED   Amphetamines POSITIVE (A) NONE DETECTED   Tetrahydrocannabinol NONE DETECTED NONE DETECTED   Barbiturates NONE DETECTED NONE DETECTED    Comment: (NOTE) DRUG SCREEN FOR MEDICAL PURPOSES ONLY.  IF CONFIRMATION IS NEEDED FOR ANY PURPOSE, NOTIFY LAB WITHIN 5 DAYS. LOWEST DETECTABLE LIMITS FOR URINE DRUG SCREEN Drug Class                     Cutoff (ng/mL) Amphetamine and metabolites    1000 Barbiturate and metabolites    200 Benzodiazepine                 466 Tricyclics and metabolites     300 Opiates and metabolites        300 Cocaine and metabolites        300 THC                            50 Performed at [facility], [place] [address] [place], [place] [address]   Comprehensive metabolic panel     Status: Abnormal   Collection Time: 07/01/18  2:09 PM  Result Value Ref Range   Sodium 139 135 - 145 mmol/L   Potassium 3.6 3.5 - 5.1 mmol/L   Chloride 103 98 - 111 mmol/L   CO2 25 22 - 32 mmol/L  Glucose, Bld 85 70 - 99 mg/dL   BUN 45 (H) 6 - 20 mg/dL   Creatinine, Ser 2.02 (H) 0.61 - 1.24 mg/dL   Calcium 9.5 8.9 - 10.3 mg/dL   Total Protein 8.5 (H) 6.5 - 8.1 g/dL   Albumin 5.1 (H) 3.5 - 5.0 g/dL   AST 25 15 - 41 U/L   ALT 16 0 - 44 U/L   Alkaline Phosphatase 63 38 - 126 U/L   Total Bilirubin 0.9 0.3 - 1.2 mg/dL   GFR calc non Af Amer 37 (L) >60 mL/min   GFR calc Af Amer 43 (L) >60 mL/min   Anion gap 11 5 - 15    Comment: Performed at [facility], [place] [address], [place], [place] [address]  Ethanol     Status: None   Collection Time: 07/01/18  2:09 PM  Result Value Ref Range   Alcohol, Ethyl (B) <10 <10 mg/dL    Comment: (NOTE) Lowest detectable limit for serum alcohol is 10 mg/dL. For medical purposes only. Performed at [facility], [place] [address], [place], [place] [address]   Salicylate level     Status: None   Collection Time: 07/01/18  2:09 PM  Result Value Ref Range   Salicylate Lvl <9.6 2.8 - 30.0 mg/dL    Comment: Performed at [facility], [place] [address], [place], [place] [address]  Acetaminophen level     Status: Abnormal   Collection Time: 07/01/18  2:09 PM  Result Value Ref Range   Acetaminophen (Tylenol), Serum <10 (L) 10 - 30 ug/mL    Comment: (NOTE) Therapeutic concentrations vary significantly. A range of 10-30 ug/mL  may be an effective concentration for many patients. However, some  are best treated at concentrations outside of this range. Acetaminophen concentrations >150 ug/mL at 4 hours after ingestion  and >50 ug/mL at 12 hours after ingestion are often associated with  toxic reactions. Performed at [facility], [place] [address], [place], [place] [address]   cbc     Status: Abnormal   Collection Time: 07/01/18  2:09 PM  Result Value Ref Range   WBC 8.1 4.0 - 10.5 K/uL   RBC 5.89 (H) 4.22 - 5.81 MIL/uL   Hemoglobin 17.0 13.0 - 17.0 g/dL   HCT 51.5 39.0 - 52.0 %   MCV 87.4 80.0 - 100.0 fL   MCH 28.9 26.0 - 34.0 pg   MCHC 33.0 30.0 - 36.0 g/dL   RDW 13.1 11.5 - 15.5 %   Platelets 286 150 - 400 K/uL   nRBC 0.0 0.0 - 0.2 %    Comment: Performed at [facility], [place] [address], [place],  [address]   Dg Chest 2 View  Result Date: 07/02/2018 CLINICAL DATA:  Cough for 3 weeks EXAM: CHEST - 2 VIEW COMPARISON:  06/30/2018 FINDINGS: The heart size and mediastinal contours are within normal limits. Both lungs are clear. The visualized skeletal structures are unremarkable. Remote median sternotomy. Atrial appendage clip. IMPRESSION: No active cardiopulmonary disease. Electronically Signed   By: Ulyses Jarred M.D.   On: 07/02/2018 02:17    Pending Labs Unresulted Labs (From admission, onward)   None      Vitals/Pain Today'Robert Vitals   07/02/18 0644 07/02/18 0731 07/02/18 0858 07/02/18 1102  BP: 119/82  (!) 134/92   Pulse: 75  83   Resp: 16  16   Temp: 97.9 F (36.6 C)  98.1 F (36.7  C)   TempSrc: Oral  Oral   SpO2: 98%  96%   PainSc:  0-No  pain 0-No pain Asleep    Isolation Precautions No active isolations  Medications Medications  acetaminophen (TYLENOL) tablet 650 mg (650 mg Oral Given 07/01/18 2324)  albuterol (PROVENTIL HFA;VENTOLIN HFA) 108 (90 Base) MCG/ACT inhaler 2 puff (has no administration in time range)  amLODipine (NORVASC) tablet 10 mg (10 mg Oral Given 07/02/18 0903)  atorvastatin (LIPITOR) tablet 40 mg (40 mg Oral Given 07/01/18 2126)  carvedilol (COREG) tablet 25 mg (25 mg Oral Given 07/02/18 0929)  cloNIDine (CATAPRES) tablet 0.3 mg (0.3 mg Oral Given 07/02/18 0859)  lisinopril (PRINIVIL,ZESTRIL) tablet 20 mg (20 mg Oral Given 07/02/18 0903)  tamsulosin (FLOMAX) capsule 0.4 mg (0.4 mg Oral Given 07/02/18 0903)  QUEtiapine (SEROQUEL) tablet 100 mg (has no administration in time range)  lidocaine-EPINEPHrine (XYLOCAINE W/EPI) 2 %-1:200000 (PF) injection 10 mL (10 mLs Infiltration Given 07/01/18 1410)  Tdap (BOOSTRIX) injection 0.5 mL (0.5 mLs Intramuscular Given 07/01/18 1411)  LORazepam (ATIVAN) tablet 1 mg (1 mg Oral Given 07/01/18 2125)  benzonatate (TESSALON) capsule 100 mg (100 mg Oral Given 07/02/18 0251)    Mobility walks     Focused Assessments  Recommendations: See Admitting Provider Note  Report given to:       Additional Notes:

## 2018-07-02 NOTE — ED Notes (Signed)
Admitted to room 43 with dressings on both wrists from self inflicted cuts. He required staples on cuts. He was asked if that still seemed like a good idea, teared up some but did not answer. Very polite and verbalized his appreciation for anything writer did for him.

## 2018-07-02 NOTE — Tx Team (Signed)
Initial Treatment Plan 07/02/2018 3:55 PM [place] Raenette Rover RFF:[id number]    PATIENT STRESSORS: Financial difficulties Medication change or noncompliance Substance abuse   PATIENT STRENGTHS: Ability for insight Average or above average intelligence Capable of independent living General fund of knowledge Motivation for treatment/growth   PATIENT IDENTIFIED PROBLEMS: Depression Suicidal thoughts Drug relapse "Just get back on my medicine"                     DISCHARGE CRITERIA:  Ability to meet basic life and health needs Improved stabilization in mood, thinking, and/or behavior Reduction of life-threatening or endangering symptoms to within safe limits Verbal commitment to aftercare and medication compliance  PRELIMINARY DISCHARGE PLAN: Attend aftercare/continuing care group  PATIENT/FAMILY INVOLVEMENT: This treatment plan has been presented to and reviewed with the patient, Raoul Ciano, and/or family member, .  The patient and family have been given the opportunity to ask questions and make suggestions.  [place], [place], [place] 07/02/2018, 3:55 PM

## 2018-07-02 NOTE — Consult Note (Addendum)
[facility] Consult   Reason for Consult:  Suicide attempt Referring Physician:  EDP Patient Identification: Robert Shepherd MRN:  [id number] Principal Diagnosis: Major depressive disorder, recurrent severe without psychotic features ([place]) Diagnosis:  Principal Problem:   Major depressive disorder, recurrent severe without psychotic features ([place]) Active Problems:   Methamphetamine abuse ([place])   Total Time spent with patient: 1 hour  Subjective:   Robert Shepherd is a 53 y.o. male patient admitted with suicide attempt.  HPI:  53 yo male who presented to the ED after using meth with suicidal ideations and not wanting to be "here".  Cut his wrists with a knife while in the ED, needing stitches.  His depression increases with drug use and decreases with less stress.  10/10 depression with anxiety of 4/10.  Poor sleep and appetite with feelings of hopelessness, helplessness, and worthlessness.  No hallucinations on assessment or homicidal ideations or withdrawal symptoms.  Stable for inpatient psychiatric unit for dual diagnosis of substance abuse and depression.    Past Psychiatric History: depression, substance abuse  Risk to Self: Suicidal Ideation: Yes-Currently Present Suicidal Intent: Yes-Currently Present(patient attempted to cut wrist ) Is patient at risk for suicide?: Yes Suicidal Plan?: Yes-Currently Present Specify Current Suicidal Plan: cut wrist  Access to Means: Yes Specify Access to Suicidal Means: pocket knife  What has been your use of drugs/alcohol within the last 12 months?: Meth How many times?: 2 Other Self Harm Risks: none reported  Triggers for Past Attempts: Other (Comment)(depression ) Intentional Self Injurious Behavior: Damaging(substance use ) Comment - Self Injurious Behavior: substance use - Meth  Risk to Others: Homicidal Ideation: No Thoughts of Harm to Others: No Current Homicidal Intent: No Current Homicidal Plan: No Access to Homicidal  Means: No Identified Victim: n/a  History of harm to others?: No Assessment of Violence: None Noted Violent Behavior Description: None Noted  Does patient have access to weapons?: No Criminal Charges Pending?: No Does patient have a court date: No Prior Inpatient Therapy: Prior Inpatient Therapy: Yes Prior Therapy Dates: 2019 Reason for Treatment: Depression  Prior Outpatient Therapy: Prior Outpatient Therapy: No Does patient have an ACCT team?: No Does patient have Intensive In-House Services?  : No Does patient have [facility] services? : No Does patient have P4CC services?: No  Past Medical History:  Past Medical History:  Diagnosis Date  . Cancer ([place])   . Hypertension   . Renal disorder     Past Surgical History:  Procedure Laterality Date  . CARDIAC SURGERY    . CYSTOSCOPY    . LITHOTRIPSY     Family History: No family history on file. Family Psychiatric  History: none Social History:  Social History   Substance and Sexual Activity  Alcohol Use Not Currently     Social History   Substance and Sexual Activity  Drug Use Not Currently    Social History   Socioeconomic History  . Marital status: Single    Spouse name: Not on file  . Number of children: Not on file  . Years of education: Not on file  . Highest education level: Not on file  Occupational History  . Not on file  Social Needs  . Financial resource strain: Not on file  . Food insecurity:    Worry: Not on file    Inability: Not on file  . Transportation needs:    Medical: Not on file    Non-medical: Not on file  Tobacco Use  . Smoking status:  Current Every Day Smoker    Types: E-cigarettes  . Smokeless tobacco: Former Systems developer    Types: [place] date: 05/12/2008  Substance and Sexual Activity  . Alcohol use: Not Currently  . Drug use: Not Currently  . Sexual activity: Not on file  Lifestyle  . Physical activity:    Days per week: Not on file    Minutes per session: Not on file  .  Stress: Not on file  Relationships  . Social connections:    Talks on phone: Not on file    Gets together: Not on file    Attends religious service: Not on file    Active member of club or organization: Not on file    Attends meetings of clubs or organizations: Not on file    Relationship status: Not on file  Other Topics Concern  . Not on file  Social History Narrative  . Not on file   Additional Social History: N/A    Allergies:   Allergies  Allergen Reactions  . Toradol [Ketorolac Tromethamine] Shortness Of Breath  . Sulfa Antibiotics Other (See Comments)    Caused Bells palsy     Labs:  Results for orders placed or performed during the hospital encounter of 07/01/18 (from the past 48 hour(s))  Rapid urine drug screen (hospital performed)     Status: Abnormal   Collection Time: 07/01/18  1:32 PM  Result Value Ref Range   Opiates POSITIVE (A) NONE DETECTED   Cocaine NONE DETECTED NONE DETECTED   Benzodiazepines NONE DETECTED NONE DETECTED   Amphetamines POSITIVE (A) NONE DETECTED   Tetrahydrocannabinol NONE DETECTED NONE DETECTED   Barbiturates NONE DETECTED NONE DETECTED    Comment: (NOTE) DRUG SCREEN FOR MEDICAL PURPOSES ONLY.  IF CONFIRMATION IS NEEDED FOR ANY PURPOSE, NOTIFY LAB WITHIN 5 DAYS. LOWEST DETECTABLE LIMITS FOR URINE DRUG SCREEN Drug Class                     Cutoff (ng/mL) Amphetamine and metabolites    1000 Barbiturate and metabolites    200 Benzodiazepine                 762 Tricyclics and metabolites     300 Opiates and metabolites        300 Cocaine and metabolites        300 THC                            50 Performed at [facility], [place] [address] [place], [place] [address]   Comprehensive metabolic panel     Status: Abnormal   Collection Time: 07/01/18  2:09 PM  Result Value Ref Range   Sodium 139 135 - 145 mmol/L   Potassium 3.6 3.5 - 5.1 mmol/L   Chloride 103 98 - 111 mmol/L   CO2 25 22 - 32 mmol/L   Glucose,  Bld 85 70 - 99 mg/dL   BUN 45 (H) 6 - 20 mg/dL   Creatinine, Ser 2.02 (H) 0.61 - 1.24 mg/dL   Calcium 9.5 8.9 - 10.3 mg/dL   Total Protein 8.5 (H) 6.5 - 8.1 g/dL   Albumin 5.1 (H) 3.5 - 5.0 g/dL   AST 25 15 - 41 U/L   ALT 16 0 - 44 U/L   Alkaline Phosphatase 63 38 - 126 U/L   Total Bilirubin 0.9 0.3 - 1.2 mg/dL   GFR calc  non Af Amer 37 (L) >60 mL/min   GFR calc Af Amer 43 (L) >60 mL/min   Anion gap 11 5 - 15    Comment: Performed at [facility], [place] [address], [place], [place] [address]  Ethanol     Status: None   Collection Time: 07/01/18  2:09 PM  Result Value Ref Range   Alcohol, Ethyl (B) <10 <10 mg/dL    Comment: (NOTE) Lowest detectable limit for serum alcohol is 10 mg/dL. For medical purposes only. Performed at [facility], [place] [address] [place], [place] [address]   Salicylate level     Status: None   Collection Time: 07/01/18  2:09 PM  Result Value Ref Range   Salicylate Lvl <5.6 2.8 - 30.0 mg/dL    Comment: Performed at [facility], [place] [address], [place], [place] [address]  Acetaminophen level     Status: Abnormal   Collection Time: 07/01/18  2:09 PM  Result Value Ref Range   Acetaminophen (Tylenol), Serum <10 (L) 10 - 30 ug/mL    Comment: (NOTE) Therapeutic concentrations vary significantly. A range of 10-30 ug/mL  may be an effective concentration for many patients. However, some  are best treated at concentrations outside of this range. Acetaminophen concentrations >150 ug/mL at 4 hours after ingestion  and >50 ug/mL at 12 hours after ingestion are often associated with  toxic reactions. Performed at [facility], [place] [address], [place], [place] [address]   cbc     Status: Abnormal   Collection Time: 07/01/18  2:09 PM  Result Value Ref Range   WBC 8.1 4.0 - 10.5 K/uL   RBC 5.89 (H) 4.22 - 5.81 MIL/uL   Hemoglobin 17.0 13.0 - 17.0 g/dL   HCT 51.5 39.0 - 52.0 %   MCV 87.4  80.0 - 100.0 fL   MCH 28.9 26.0 - 34.0 pg   MCHC 33.0 30.0 - 36.0 g/dL   RDW 13.1 11.5 - 15.5 %   Platelets 286 150 - 400 K/uL   nRBC 0.0 0.0 - 0.2 %    Comment: Performed at [facility], [place] [address], [place], [place] [address]    Current Facility-Administered Medications  Medication Dose Route Frequency Provider Last Rate Last Dose  . acetaminophen (TYLENOL) tablet 650 mg  650 mg Oral Q6H PRN Hayden Rasmussen, MD   650 mg at 07/01/18 2324  . albuterol (PROVENTIL HFA;VENTOLIN HFA) 108 (90 Base) MCG/ACT inhaler 2 puff  2 puff Inhalation Q6H PRN Hayden Rasmussen, MD      . amLODipine (NORVASC) tablet 10 mg  10 mg Oral Daily Hayden Rasmussen, MD   10 mg at 07/02/18 0903  . atorvastatin (LIPITOR) tablet 40 mg  40 mg Oral QHS Hayden Rasmussen, MD   40 mg at 07/01/18 2126  . carvedilol (COREG) tablet 25 mg  25 mg Oral Daily Hayden Rasmussen, MD   25 mg at 07/02/18 0929  . cloNIDine (CATAPRES) tablet 0.3 mg  0.3 mg Oral TID Hayden Rasmussen, MD   0.3 mg at 07/02/18 0859  . lisinopril (PRINIVIL,ZESTRIL) tablet 20 mg  20 mg Oral Daily Hayden Rasmussen, MD   20 mg at 07/02/18 5284  . QUEtiapine (SEROQUEL) tablet 100 mg  100 mg Oral QHS Faythe Dingwall, DO      . tamsulosin (FLOMAX) capsule 0.4 mg  0.4 mg Oral Daily Hayden Rasmussen, MD   0.4 mg  at 07/02/18 0903   Current Outpatient Medications  Medication Sig Dispense Refill  . acetaminophen (TYLENOL) 325 MG tablet Take 650 mg by mouth every 6 (six) hours as needed for headache (pain).    Marland Kitchen albuterol (PROVENTIL HFA;VENTOLIN HFA) 108 (90 Base) MCG/ACT inhaler Inhale 2 puffs into the lungs every 6 (six) hours as needed for wheezing or shortness of breath.    Marland Kitchen amLODipine (NORVASC) 10 MG tablet Take 1 tablet (10 mg total) by mouth daily. 30 tablet 1  . atorvastatin (LIPITOR) 40 MG tablet Take 1 tablet (40 mg total) by mouth daily. (Patient taking differently: Take 40 mg by mouth at bedtime. ) 30 tablet 1  .  carvedilol (COREG) 25 MG tablet Take 1 tablet (25 mg total) by mouth 2 (two) times daily with a meal. (Patient taking differently: Take 25 mg by mouth daily. ) 60 tablet 1  . cloNIDine (CATAPRES) 0.3 MG tablet Take 1 tablet (0.3 mg total) by mouth 3 (three) times daily for 30 days. 90 tablet 0  . ibuprofen (ADVIL,MOTRIN) 200 MG tablet Take 400 mg by mouth every 6 (six) hours as needed for headache (pain).    Marland Kitchen lisinopril (PRINIVIL,ZESTRIL) 20 MG tablet Take 1 tablet (20 mg total) by mouth 2 (two) times daily. 60 tablet 0  . tamsulosin (FLOMAX) 0.4 MG CAPS capsule Take 1 capsule (0.4 mg total) by mouth daily. 30 capsule 1    Musculoskeletal: Strength & Muscle Tone: within normal limits Gait & Station: normal Patient leans: N/A  Psychiatric Specialty Exam: Physical Exam  Nursing note and vitals reviewed. Constitutional: He is oriented to person, place, and time. He appears well-developed and well-nourished.  HENT:  Head: Normocephalic.  Neck: Normal range of motion.  Respiratory: Effort normal.  Genitourinary:    Prostate normal.   Musculoskeletal: Normal range of motion.  Neurological: He is alert and oriented to person, place, and time.  Psychiatric: His speech is normal and behavior is normal. His mood appears anxious. Cognition and memory are normal. He expresses impulsivity. He exhibits a depressed mood. He expresses suicidal ideation.    Review of Systems  Constitutional: Positive for malaise/fatigue.  Respiratory: Positive for cough.   Psychiatric/Behavioral: Positive for depression, substance abuse and suicidal ideas. The patient is nervous/anxious.   All other systems reviewed and are negative.   Blood pressure (!) 134/92, pulse 83, temperature 98.1 F (36.7 C), temperature source Oral, resp. rate 16, SpO2 96 %.There is no height or weight on file to calculate BMI.  General Appearance: Disheveled  Eye Contact:  Fair  Speech:  Slow  Volume:  Decreased  Mood:  Anxious and  Depressed  Affect:  Congruent  Thought Process:  Coherent and Descriptions of Associations: Intact  Orientation:  Full (Time, Place, and Person)  Thought Content:  Rumination  Suicidal Thoughts:  Yes.  with intent/plan, contracts for safety in hospital.  Homicidal Thoughts:  No  Memory:  Immediate;   Fair Recent;   Fair Remote;   Fair  Judgement:  Poor  Insight:  Fair  Psychomotor Activity:  Decreased  Concentration:  Concentration: Fair and Attention Span: Fair  Recall:  [employer] of Knowledge:  Fair  Language:  Good  Akathisia:  No  Handed:  Right  AIMS (if indicated):   N/A  Assets:  Leisure Time Physical Health Resilience  ADL's:  Intact  Cognition:  WNL  Sleep:   N/A     Treatment Plan Summary: Daily contact with patient to assess and  evaluate symptoms and progress in treatment, Medication management and Plan major depressive disorder, recurrent, severe without psychosis:  -Continued Seroquel 100 mg at bedtime -Admit to [facility] 300 hall  HTN: -Continued Norvasc 10 mg daily -Continued Coreg 25 mg daily -Continued Lisinopril 20 mg daily -Continued clonidine 0.3 mg TID  Hyperlipidemia: -Restarted Lipitor 40 mg qhs  BPH: -Restarted Flomax 0.4 mg daily  Disposition: Recommend psychiatric Inpatient admission when medically cleared.  Waylan Boga, NP 07/02/2018 11:11 AM   Patient seen face-to-face for psychiatric evaluation, chart reviewed and case discussed with the physician extender and developed treatment plan. Reviewed the information documented and agree with the treatment plan.  Buford Dresser, DO 07/02/18 6:13 PM

## 2018-07-02 NOTE — BH Assessment (Signed)
[facility] Assessment Progress Note  Per Buford Dresser, DO, this pt requires psychiatric hospitalization at this time.  Letitia Libra, RN, [facility] has assigned pt to [facility] Rm 304-2; [facility] will be ready to receive pt at 13:30.  Pt has signed Voluntary Admission and Consent for Treatment, as well as Consent to Release Information to his daughter, and signed forms have been faxed to [facility].  Pt's nurse, Caryl Pina, has been notified, and agrees to send original paperwork along with pt via [place], and to call report to [phone].  Jalene Mullet, [place] Coordinator [phone]

## 2018-07-02 NOTE — Progress Notes (Signed)
Robert Shepherd is a 53 year old male pt admitted on voluntary basis. On admission he endorses passive SI but able to contract for safety while in the hospital. He reports that he relapsed recently and made some lacerations to his right wrist. He reports that he has not been on any medications in the past few months. He reports that he was living in a sober living home but unsure of where he will go once he is discharged. He reports that he is from [place] and that most of his family is down there. Robert Shepherd was cooperative during admission, oriented to the milieu and safety maintained.

## 2018-07-02 NOTE — ED Notes (Signed)
PT AWARE OF ADMISSION TO [facility]

## 2018-07-02 NOTE — ED Notes (Signed)
MASK GIVEN FOR NON-PRODUCTIVE COUGH

## 2018-07-02 NOTE — ED Notes (Signed)
Waiting for the Xray to result. Continues to cough, hot tea given to him to try to relax his cough.

## 2018-07-03 DIAGNOSIS — F1520 Other stimulant dependence, uncomplicated: Secondary | ICD-10-CM

## 2018-07-03 DIAGNOSIS — F1594 Other stimulant use, unspecified with stimulant-induced mood disorder: Secondary | ICD-10-CM

## 2018-07-03 DIAGNOSIS — F15951 Other stimulant use, unspecified with stimulant-induced psychotic disorder with hallucinations: Secondary | ICD-10-CM

## 2018-07-03 MED ORDER — QUETIAPINE FUMARATE 200 MG PO TABS
200.0000 mg | ORAL_TABLET | Freq: Every day | ORAL | Status: DC
  Administered 2018-07-03: 200 mg via ORAL
  Filled 2018-07-03 (×2): qty 1

## 2018-07-03 MED ORDER — BACITRACIN-NEOMYCIN-POLYMYXIN OINTMENT TUBE
TOPICAL_OINTMENT | Freq: Three times a day (TID) | CUTANEOUS | Status: DC
  Administered 2018-07-03: 1 via TOPICAL
  Administered 2018-07-04 – 2018-07-11 (×10): via TOPICAL
  Filled 2018-07-03: qty 14.17

## 2018-07-03 MED ORDER — SERTRALINE HCL 25 MG PO TABS
25.0000 mg | ORAL_TABLET | Freq: Every day | ORAL | Status: DC
  Administered 2018-07-03 – 2018-07-04 (×2): 25 mg via ORAL
  Filled 2018-07-03 (×3): qty 1

## 2018-07-03 NOTE — Plan of Care (Signed)
  Problem: Activity: Goal: Interest or engagement in activities will improve Outcome: Not Progressing   Problem: Coping: Goal: Ability to verbalize frustrations and anger appropriately will improve Outcome: Progressing   D: Pt alert and oriented on the unit. Pt engaging with RN staff and other pts. Pt denies SI/HI, A/VH. PT's goal for today is "To try and straighten my life back out." Pt was isolative in his room asleep most of the day and did not participate in groups. Pt cooperative. A: Education, support and encouragement provided, q15 minute safety checks remain in effect. Medications administered per MD orders. R: No reactions/side effects to medicine noted. Pt denies any concerns at this time, and verbally contracts for safety. Pt ambulating on the unit with no issues. Pt remains safe on and off the unit.
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
## 2018-07-03 NOTE — Plan of Care (Signed)
D: Patient is alert and cooperative. Patient endorses AH described as voices that are inaudible that have been there on and off for a few years. Denies SI, HI, VH, and verbally contracts for safety. Patient denies physical symptoms/pain. Patient states his day was "not too bad". Patient affect is sullen/flat.    A: Medications administered per MD order. Support provided. Patient educated on safety on the unit and medications. Routine safety checks every 15 minutes. Patient stated understanding to tell nurse about any new physical symptoms. Patient understands to tell staff of any needs.     R: No adverse drug reactions noted. Patient verbally contracts for safety. Patient remains safe at this time and will continue to monitor.   Problem: Education: Goal: Knowledge of [facility] Education information/materials will improve Outcome: Progressing   Problem: Safety: Goal: Periods of time without injury will increase Outcome: Progressing   Patient is oriented to the unit. Patient remains safe and will continue to monitor.

## 2018-07-03 NOTE — BHH Suicide Risk Assessment (Signed)
[facility] Admission Suicide Risk Assessment   Nursing information obtained from:  Patient Demographic factors:  Male, Caucasian, Low socioeconomic status, Living alone, Unemployed Current Mental Status:  Suicidal ideation indicated by patient, Self-harm thoughts Loss Factors:  Financial problems / change in socioeconomic status Historical Factors:  Family history of mental illness or substance abuse Risk Reduction Factors:  Positive coping skills or problem solving skills  Total Time spent with patient: 30 minutes Principal Problem: <principal problem not specified> Diagnosis:  Active Problems:   Major depressive disorder, recurrent severe without psychotic features ([place])  Subjective Data: Patient is seen and examined.  Patient is a 53 year old male with a past psychiatric history significant for methamphetamine dependence since induced mood disorder, substance-induced psychotic disorder, depression and anxiety who presented to the [facility] emergency department on 07/01/2018 with suicidal ideation.  The patient had been staying in a sober living house and had been sober since his discharge from a [facility] in August 2019.  He had 2 colleagues at the sober living house decided to leave and get jobs.  He interviewed for a job on Friday and was undergoing a background check, but he and his friends went out and started abusing substances that night.  He stated that he had used methamphetamines, and his drug screen was also positive for opiates, but he denied any opiate use.  He reported visual hallucinations as well as suicidal ideation in the emergency department.  He also had some paranoid thoughts.  He stated he was not sleeping, and only slept about 3 hours.  He was very sad and despondent over the fact that he had relapsed because he was "just getting back together with my family".  He has a past medical history significant for congestive heart failure, coronary artery disease,  hyperlipidemia and hypertension.  He had been sober for the last 7 months.  He had previously been on 600 mg of Seroquel at at bedtime.  He was admitted to the hospital for evaluation and stabilization.  Continued Clinical Symptoms:  Alcohol Use Disorder Identification Test Final Score (AUDIT): 0 The "Alcohol Use Disorders Identification Test", Guidelines for Use in Primary Care, Second Edition.  World Pharmacologist [facility]). Score between 0-7:  no or low risk or alcohol related problems. Score between 8-15:  moderate risk of alcohol related problems. Score between 16-19:  high risk of alcohol related problems. Score 20 or above:  warrants further diagnostic evaluation for alcohol dependence and treatment.   CLINICAL FACTORS:   Depression:   Anhedonia Comorbid alcohol abuse/dependence Delusional Hopelessness Impulsivity Insomnia Alcohol/Substance Abuse/Dependencies Previous Psychiatric Diagnoses and Treatments Medical Diagnoses and Treatments/Surgeries   Musculoskeletal: Strength & Muscle Tone: within normal limits Gait & Station: normal Patient leans: N/A  Psychiatric Specialty Exam: Physical Exam  Nursing note and vitals reviewed. Constitutional: He is oriented to person, place, and time. He appears well-developed and well-nourished.  HENT:  Head: Normocephalic and atraumatic.  Respiratory: Effort normal.  Neurological: He is alert and oriented to person, place, and time.    ROS  Blood pressure (!) 131/96, pulse 93, temperature 98.2 F (36.8 C), temperature source Oral, resp. rate 18, height 5\' 4"  (1.626 m), weight 67.1 kg.Body mass index is 25.4 kg/m.  General Appearance: Disheveled  Eye Contact:  Fair  Speech:  Normal Rate  Volume:  Decreased  Mood:  Anxious and Depressed  Affect:  Congruent  Thought Process:  Coherent and Descriptions of Associations: Intact  Orientation:  Full (Time, Place, and Person)  Thought Content:  Hallucinations: Visual  Suicidal  Thoughts:  Yes.  without intent/plan  Homicidal Thoughts:  No  Memory:  Immediate;   Fair Recent;   Fair Remote;   Fair  Judgement:  Intact  Insight:  Fair  Psychomotor Activity:  Psychomotor Retardation  Concentration:  Concentration: Fair and Attention Span: Fair  Recall:  [employer] of Knowledge:  Fair  Language:  Fair  Akathisia:  Negative  Handed:  Right  AIMS (if indicated):     Assets:  Desire for Improvement Leisure Time Resilience  ADL's:  Intact  Cognition:  WNL  Sleep:  Number of Hours: 6.5      COGNITIVE FEATURES THAT CONTRIBUTE TO RISK:  None    SUICIDE RISK:   Moderate:  Frequent suicidal ideation with limited intensity, and duration, some specificity in terms of plans, no associated intent, good self-control, limited dysphoria/symptomatology, some risk factors present, and identifiable protective factors, including available and accessible social support.  PLAN OF CARE: Patient is seen and examined.  Patient is a 53 year old male with the above-stated past psychiatric history who presented to the [facility] emergency department with suicidal ideation and laceration to his right anterior wrist.  He will be admitted to the hospital.  He will be integrated into the milieu.  He will be encouraged to attend groups.  We will restart his Seroquel.  He was previously on 400 mg at at bedtime, and I will increase it to 200 mg p.o. nightly and those will be titrated during the course the hospitalization.  As well, with his depressive symptoms I will start Zoloft 25 mg p.o. daily and titrate that during the course the hospitalization.  His drug screen was positive for methamphetamines as well as opiates, but he stated he does not use opiates, and that this was from pain medication he received while in the hospital.  He does have a history of atrial fibrillation, congestive heart failure, coronary artery disease, hyperlipidemia, chronic renal disease as well as  hypertension.  These medications including amlodipine, Lipitor, Coreg, Catapres, lisinopril and Flomax will be continued.  He also has a history of COPD and will have available a albuterol inhaler.  Review of his laboratories revealed a creatinine of 2.02.  This will be monitored during the course the hospitalization.  His creatinine in August 2019 while he was at the [facility] was recorded 2.01, 1.93, and 1.90.  His liver function enzymes are normal.  His troponin in the emergency department was negative.  Blood alcohol was less than 10.  Drug screen as mentioned above.  We will monitor his improvement during the course the hospitalization.  His vital signs are currently stable, he is afebrile.  His heart rate is in the 90s and stable.  I certify that inpatient services furnished can reasonably be expected to improve the patient's condition.   Sharma Covert, MD 07/03/2018, 10:26 AM

## 2018-07-03 NOTE — H&P (Signed)
Psychiatric Admission Assessment Adult  Patient Identification: Robert Shepherd MRN:  [id number] Date of Evaluation:  07/03/2018 Chief Complaint:  MDD AMPHETAMINE USE DISORDER Principal Diagnosis: <principal problem not specified> Diagnosis:  Active Problems:   Major depressive disorder, recurrent severe without psychotic features ([place])  History of Present Illness: Patient is seen and examined.  Patient is a 53 year old male with a past psychiatric history significant for methamphetamine dependence since induced mood disorder, substance-induced psychotic disorder, depression and anxiety who presented to the [facility] emergency department on 07/01/2018 with suicidal ideation.  The patient had been staying in a sober living house and had been sober since his discharge from a [facility] in August 2019.  He had 2 colleagues at the sober living house decided to leave and get jobs.  He interviewed for a job on Friday and was undergoing a background check, but he and his friends went out and started abusing substances that night.  He stated that he had used methamphetamines, and his drug screen was also positive for opiates, but he denied any opiate use.  He reported visual hallucinations as well as suicidal ideation in the emergency department.  He also had some paranoid thoughts.  He stated he was not sleeping, and only slept about 3 hours.  He was very sad and despondent over the fact that he had relapsed because he was "just getting back together with my family".  He has a past medical history significant for congestive heart failure, coronary artery disease, hyperlipidemia and hypertension.  He had been sober for the last 7 months.  He had previously been on 600 mg of Seroquel at at bedtime.  He was admitted to the hospital for evaluation and stabilization.  Associated Signs/Symptoms: Depression Symptoms:  depressed mood, anhedonia, insomnia, psychomotor  agitation, fatigue, feelings of worthlessness/guilt, difficulty concentrating, hopelessness, suicidal thoughts without plan, suicidal attempt, anxiety, loss of energy/fatigue, disturbed sleep, (Hypo) Manic Symptoms:  Impulsivity, Irritable Mood, Labiality of Mood, Anxiety Symptoms:  Excessive Worry, Psychotic Symptoms:  Delusions, Hallucinations: Visual PTSD Symptoms: Negative Total Time spent with patient: 30 minutes  Past Psychiatric History: Patient has one previous psychiatric admission 6 months ago at the [facility] hospital.  He was diagnosed with amphetamine-induced disorders as well as amphetamine dependence at that time.  He was discharged on Seroquel.  He has not been in psychiatric follow-up after being discharged from the hospital.  He has reportedly been off his medications for at least 6 months.  Is the patient at risk to self? Yes.    Has the patient been a risk to self in the past 6 months? Yes.    Has the patient been a risk to self within the distant past? No.  Is the patient a risk to others? No.  Has the patient been a risk to others in the past 6 months? No.  Has the patient been a risk to others within the distant past? No.   Prior Inpatient Therapy:   Prior Outpatient Therapy:    Alcohol Screening: 1. How often do you have a drink containing alcohol?: Never 2. How many drinks containing alcohol do you have on a typical day when you are drinking?: 1 or 2 3. How often do you have six or more drinks on one occasion?: Never AUDIT-C Score: 0 4. How often during the last year have you found that you were not able to stop drinking once you had started?: Never 5. How often during the last year have you failed  to do what was normally expected from you becasue of drinking?: Never 6. How often during the last year have you needed a first drink in the morning to get yourself going after a heavy drinking session?: Never 7. How often during the last year have you had a  feeling of guilt of remorse after drinking?: Never 8. How often during the last year have you been unable to remember what happened the night before because you had been drinking?: Never 9. Have you or someone else been injured as a result of your drinking?: No 10. Has a relative or friend or a doctor or another health worker been concerned about your drinking or suggested you cut down?: No Alcohol Use Disorder Identification Test Final Score (AUDIT): 0 Alcohol Brief Interventions/Follow-up: AUDIT Score <7 follow-up not indicated Substance Abuse History in the last 12 months:  Yes.   Consequences of Substance Abuse: Medical Consequences:  : Most likely is contributed to his coronary artery disease, his congestive heart failure and his renal disease. Family Consequences:  : He stated his drug use had caused him to be distance from his family. Previous Psychotropic Medications: Yes  Psychological Evaluations: Yes  Past Medical History:  Past Medical History:  Diagnosis Date  . Cancer ([place])   . Hypertension   . Renal disorder     Past Surgical History:  Procedure Laterality Date  . CARDIAC SURGERY    . CYSTOSCOPY    . LITHOTRIPSY     Family History: History reviewed. No pertinent family history. Family Psychiatric  History: Non-contributory Tobacco Screening: Have you used any form of tobacco in the last 30 days? (Cigarettes, Smokeless Tobacco, Cigars, and/or Pipes): Yes Tobacco use, Select all that apply: 5 or more cigarettes per day Are you interested in Tobacco Cessation Medications?: Yes, will notify MD for an order Counseled patient on smoking cessation including recognizing danger situations, developing coping skills and basic information about quitting provided: Refused/Declined practical counseling Social History:  Social History   Substance and Sexual Activity  Alcohol Use Not Currently     Social History   Substance and Sexual Activity  Drug Use Yes    Additional  Social History:                           Allergies:   Allergies  Allergen Reactions  . Toradol [Ketorolac Tromethamine] Shortness Of Breath  . Sulfa Antibiotics Other (See Comments)    Caused Bells palsy    Lab Results:  Results for orders placed or performed during the hospital encounter of 07/01/18 (from the past 48 hour(s))  Rapid urine drug screen (hospital performed)     Status: Abnormal   Collection Time: 07/01/18  1:32 PM  Result Value Ref Range   Opiates POSITIVE (A) NONE DETECTED   Cocaine NONE DETECTED NONE DETECTED   Benzodiazepines NONE DETECTED NONE DETECTED   Amphetamines POSITIVE (A) NONE DETECTED   Tetrahydrocannabinol NONE DETECTED NONE DETECTED   Barbiturates NONE DETECTED NONE DETECTED    Comment: (NOTE) DRUG SCREEN FOR MEDICAL PURPOSES ONLY.  IF CONFIRMATION IS NEEDED FOR ANY PURPOSE, NOTIFY LAB WITHIN 5 DAYS. LOWEST DETECTABLE LIMITS FOR URINE DRUG SCREEN Drug Class                     Cutoff (ng/mL) Amphetamine and metabolites    1000 Barbiturate and metabolites    200 Benzodiazepine  235 Tricyclics and metabolites     300 Opiates and metabolites        300 Cocaine and metabolites        300 THC                            50 Performed at [facility], [place] [address], [place], [place] [address]   Comprehensive metabolic panel     Status: Abnormal   Collection Time: 07/01/18  2:09 PM  Result Value Ref Range   Sodium 139 135 - 145 mmol/L   Potassium 3.6 3.5 - 5.1 mmol/L   Chloride 103 98 - 111 mmol/L   CO2 25 22 - 32 mmol/L   Glucose, Bld 85 70 - 99 mg/dL   BUN 45 (H) 6 - 20 mg/dL   Creatinine, Ser 2.02 (H) 0.61 - 1.24 mg/dL   Calcium 9.5 8.9 - 10.3 mg/dL   Total Protein 8.5 (H) 6.5 - 8.1 g/dL   Albumin 5.1 (H) 3.5 - 5.0 g/dL   AST 25 15 - 41 U/L   ALT 16 0 - 44 U/L   Alkaline Phosphatase 63 38 - 126 U/L   Total Bilirubin 0.9 0.3 - 1.2 mg/dL   GFR calc non Af Amer 37 (L) >60 mL/min   GFR calc Af  Amer 43 (L) >60 mL/min   Anion gap 11 5 - 15    Comment: Performed at [facility], [place] [address], [place], [place] [address]  Ethanol     Status: None   Collection Time: 07/01/18  2:09 PM  Result Value Ref Range   Alcohol, Ethyl (B) <10 <10 mg/dL    Comment: (NOTE) Lowest detectable limit for serum alcohol is 10 mg/dL. For medical purposes only. Performed at [facility], [place] [address], [place], [place] [address]   Salicylate level     Status: None   Collection Time: 07/01/18  2:09 PM  Result Value Ref Range   Salicylate Lvl <1.9 2.8 - 30.0 mg/dL    Comment: Performed at [facility], [place] [address], [place], [place] [address]  Acetaminophen level     Status: Abnormal   Collection Time: 07/01/18  2:09 PM  Result Value Ref Range   Acetaminophen (Tylenol), Serum <10 (L) 10 - 30 ug/mL    Comment: (NOTE) Therapeutic concentrations vary significantly. A range of 10-30 ug/mL  may be an effective concentration for many patients. However, some  are best treated at concentrations outside of this range. Acetaminophen concentrations >150 ug/mL at 4 hours after ingestion  and >50 ug/mL at 12 hours after ingestion are often associated with  toxic reactions. Performed at [facility], [place] [address] [place], [place] [address]   cbc     Status: Abnormal   Collection Time: 07/01/18  2:09 PM  Result Value Ref Range   WBC 8.1 4.0 - 10.5 K/uL   RBC 5.89 (H) 4.22 - 5.81 MIL/uL   Hemoglobin 17.0 13.0 - 17.0 g/dL   HCT 51.5 39.0 - 52.0 %   MCV 87.4 80.0 - 100.0 fL   MCH 28.9 26.0 - 34.0 pg   MCHC 33.0 30.0 - 36.0 g/dL   RDW 13.1 11.5 - 15.5 %   Platelets 286 150 - 400 K/uL   nRBC 0.0 0.0 - 0.2 %    Comment: Performed at [facility], [place] [address] [place], [place] [address]  Blood Alcohol level:  Lab Results  Component Value Date   ETH <10 00/76/2263    Metabolic Disorder Labs:  No  results found for: HGBA1C, MPG No results found for: PROLACTIN No results found for: CHOL, TRIG, HDL, CHOLHDL, VLDL, LDLCALC  Current Medications: Current Facility-Administered Medications  Medication Dose Route Frequency Provider Last Rate Last Dose  . acetaminophen (TYLENOL) tablet 650 mg  650 mg Oral Q6H PRN Patrecia Pour, NP      . albuterol (PROVENTIL HFA;VENTOLIN HFA) 108 (90 Base) MCG/ACT inhaler 2 puff  2 puff Inhalation Q6H PRN Patrecia Pour, NP      . alum & mag hydroxide-simeth (MAALOX/MYLANTA) 200-200-20 MG/5ML suspension 30 mL  30 mL Oral Q4H PRN Patrecia Pour, NP      . amLODipine (NORVASC) tablet 10 mg  10 mg Oral Daily Patrecia Pour, NP   10 mg at 07/03/18 3354  . atorvastatin (LIPITOR) tablet 40 mg  40 mg Oral QHS Patrecia Pour, NP   40 mg at 07/02/18 2233  . carvedilol (COREG) tablet 25 mg  25 mg Oral Daily Patrecia Pour, NP   25 mg at 07/03/18 5625  . cloNIDine (CATAPRES) tablet 0.3 mg  0.3 mg Oral TID Patrecia Pour, NP   0.3 mg at 07/03/18 6389  . Influenza vac split quadrivalent PF (FLUARIX) injection 0.5 mL  0.5 mL Intramuscular Tomorrow-1000 Sharma Covert, MD      . lisinopril (PRINIVIL,ZESTRIL) tablet 20 mg  20 mg Oral Daily Patrecia Pour, NP   20 mg at 07/03/18 3734  . magnesium hydroxide (MILK OF MAGNESIA) suspension 30 mL  30 mL Oral Daily PRN Patrecia Pour, NP      . nicotine (NICODERM CQ - dosed in mg/24 hours) patch 21 mg  21 mg Transdermal Daily Sharma Covert, MD   21 mg at 07/03/18 0752  . QUEtiapine (SEROQUEL) tablet 200 mg  200 mg Oral QHS Sharma Covert, MD      . sertraline (ZOLOFT) tablet 25 mg  25 mg Oral Daily Sharma Covert, MD      . tamsulosin [facility]) capsule 0.4 mg  0.4 mg Oral Daily Patrecia Pour, NP   0.4 mg at 07/03/18 2876   PTA Medications: Medications Prior to Admission  Medication Sig Dispense Refill Last Dose  . acetaminophen (TYLENOL) 325 MG tablet Take 650 mg by mouth every 6 (six) hours as needed for  headache (pain).   06/28/2018  . albuterol (PROVENTIL HFA;VENTOLIN HFA) 108 (90 Base) MCG/ACT inhaler Inhale 2 puffs into the lungs every 6 (six) hours as needed for wheezing or shortness of breath.   07/01/2018  . amLODipine (NORVASC) 10 MG tablet Take 1 tablet (10 mg total) by mouth daily. 30 tablet 1 4-5 days ago  . atorvastatin (LIPITOR) 40 MG tablet Take 1 tablet (40 mg total) by mouth daily. (Patient taking differently: Take 40 mg by mouth at bedtime. ) 30 tablet 1 06/30/2018  . carvedilol (COREG) 25 MG tablet Take 1 tablet (25 mg total) by mouth 2 (two) times daily with a meal. (Patient taking differently: Take 25 mg by mouth daily. ) 60 tablet 1 07/01/2018 at 8am  . cloNIDine (CATAPRES) 0.3 MG tablet Take 1 tablet (0.3 mg total) by mouth 3 (three) times daily for 30 days. 90 tablet 0 07/01/2018  . ibuprofen (ADVIL,MOTRIN) 200 MG tablet Take 400 mg by mouth every 6 (six) hours as needed for headache (pain).  06/29/2018  . lisinopril (PRINIVIL,ZESTRIL) 20 MG tablet Take 1 tablet (20 mg total) by mouth 2 (two) times daily. 60 tablet 0 07/01/2018  . tamsulosin (FLOMAX) 0.4 MG CAPS capsule Take 1 capsule (0.4 mg total) by mouth daily. 30 capsule 1 06/30/2018    Musculoskeletal: Strength & Muscle Tone: within normal limits Gait & Station: normal Patient leans: N/A  Psychiatric Specialty Exam: Physical Exam  Nursing note and vitals reviewed. Constitutional: He is oriented to person, place, and time. He appears well-developed and well-nourished.  HENT:  Head: Normocephalic and atraumatic.  Respiratory: Effort normal.  Neurological: He is alert and oriented to person, place, and time.    ROS  Blood pressure (!) 131/96, pulse 93, temperature 98.2 F (36.8 C), temperature source Oral, resp. rate 18, height 5\' 4"  (1.626 m), weight 67.1 kg.Body mass index is 25.4 kg/m.  General Appearance: Disheveled  Eye Contact:  Fair  Speech:  Normal Rate  Volume:  Decreased  Mood:  Depressed  Affect:   Congruent  Thought Process:  Coherent and Descriptions of Associations: Intact  Orientation:  Full (Time, Place, and Person)  Thought Content:  Logical  Suicidal Thoughts:  Yes.  without intent/plan  Homicidal Thoughts:  No  Memory:  Immediate;   Fair Recent;   Fair Remote;   Fair  Judgement:  Intact  Insight:  Fair  Psychomotor Activity:  Psychomotor Retardation  Concentration:  Concentration: Fair and Attention Span: Fair  Recall:  [employer] of Knowledge:  Fair  Language:  Fair  Akathisia:  Negative  Handed:  Right  AIMS (if indicated):     Assets:  Communication Skills Desire for Improvement Resilience  ADL's:  Intact  Cognition:  WNL  Sleep:  Number of Hours: 6.5    Treatment Plan Summary: Daily contact with patient to assess and evaluate symptoms and progress in treatment, Medication management and Plan : Patient is seen and examined.  Patient is a 53 year old male with the above-stated past psychiatric history who presented to the [facility] emergency department with suicidal ideation and laceration to his right anterior wrist.  He will be admitted to the hospital.  He will be integrated into the milieu.  He will be encouraged to attend groups.  We will restart his Seroquel.  He was previously on 400 mg at at bedtime, and I will increase it to 200 mg p.o. nightly and those will be titrated during the course the hospitalization.  As well, with his depressive symptoms I will start Zoloft 25 mg p.o. daily and titrate that during the course the hospitalization.  His drug screen was positive for methamphetamines as well as opiates, but he stated he does not use opiates, and that this was from pain medication he received while in the hospital.  He does have a history of atrial fibrillation, congestive heart failure, coronary artery disease, hyperlipidemia, chronic renal disease as well as hypertension.  These medications including amlodipine, Lipitor, Coreg, Catapres,  lisinopril and Flomax will be continued.  He also has a history of COPD and will have available a albuterol inhaler.  Review of his laboratories revealed a creatinine of 2.02.  This will be monitored during the course the hospitalization.  His creatinine in August 2019 while he was at the [facility] was recorded 2.01, 1.93, and 1.90.  His liver function enzymes are normal.  His troponin in the emergency department was negative.  Blood alcohol was less than 10.  Drug screen as mentioned above.  We will monitor his improvement during the course the hospitalization.  His vital signs are currently stable, he is afebrile.  His heart rate is in the 90s and stable.  Observation Level/Precautions:  Detox 15 minute checks  Laboratory:  Chemistry Profile  Psychotherapy:    Medications:    Consultations:    Discharge Concerns:    Estimated LOS:  Other:     Physician Treatment Plan for Primary Diagnosis: <principal problem not specified> Long Term Goal(s): Improvement in symptoms so as ready for discharge  Short Term Goals: Ability to identify changes in lifestyle to reduce recurrence of condition will improve, Ability to verbalize feelings will improve, Ability to disclose and discuss suicidal ideas, Ability to demonstrate self-control will improve, Ability to identify and develop effective coping behaviors will improve, Ability to maintain clinical measurements within normal limits will improve, Compliance with prescribed medications will improve and Ability to identify triggers associated with substance abuse/mental health issues will improve  Physician Treatment Plan for Secondary Diagnosis: Active Problems:   Major depressive disorder, recurrent severe without psychotic features ([place])  Long Term Goal(s): Improvement in symptoms so as ready for discharge  Short Term Goals: Ability to identify changes in lifestyle to reduce recurrence of condition will improve, Ability to verbalize feelings will  improve, Ability to disclose and discuss suicidal ideas, Ability to demonstrate self-control will improve, Ability to identify and develop effective coping behaviors will improve, Ability to maintain clinical measurements within normal limits will improve, Compliance with prescribed medications will improve and Ability to identify triggers associated with substance abuse/mental health issues will improve  I certify that inpatient services furnished can reasonably be expected to improve the patient's condition.    Sharma Covert, MD 3/17/202010:38 AM

## 2018-07-03 NOTE — Progress Notes (Signed)
Adult Psychoeducational Group Note  Date:  07/03/2018 Time:  11:47 PM  Group Topic/Focus:  Wrap-Up Group:   The focus of this group is to help patients review their daily goal of treatment and discuss progress on daily workbooks.  Participation Level:  Active  Participation Quality:  Appropriate  Affect:  Appropriate  Cognitive:  Appropriate  Insight: Appropriate  Engagement in Group:  Engaged  Modes of Intervention:  Discussion  Additional Comments:  Patient attended group and participated.   Leonard Hendler W Deeana Atwater 9/99/6722, 11:47 PM

## 2018-07-03 NOTE — Progress Notes (Signed)
Patient has a [facility] intake appointment on Thursday, 07/05/18.  Patient's discharge orders will need to be in on 07/04/2018 after 3pm  Patient will need to take a taxi at 7:00am from [facility] on 07/05/2018  Patient will need a 28 day supply of medication   Stephanie Acre, [place] Social Worker

## 2018-07-04 LAB — BASIC METABOLIC PANEL
Anion gap: 8 (ref 5–15)
BUN: 29 mg/dL — AB (ref 6–20)
CHLORIDE: 111 mmol/L (ref 98–111)
CO2: 21 mmol/L — ABNORMAL LOW (ref 22–32)
Calcium: 9 mg/dL (ref 8.9–10.3)
Creatinine, Ser: 1.33 mg/dL — ABNORMAL HIGH (ref 0.61–1.24)
GFR calc Af Amer: >60 mL/min (ref >60)
GFR calc non Af Amer: >60 mL/min (ref >60)
Glucose, Bld: 95 mg/dL (ref 70–99)
Potassium: 3.9 mmol/L (ref 3.5–5.1)
SODIUM: 140 mmol/L (ref 135–145)

## 2018-07-04 LAB — BRAIN NATRIURETIC PEPTIDE: B Natriuretic Peptide: 37.3 pg/mL (ref 0.0–100.0)

## 2018-07-04 MED ORDER — SERTRALINE HCL 50 MG PO TABS
50.0000 mg | ORAL_TABLET | Freq: Every day | ORAL | Status: DC
  Administered 2018-07-05 – 2018-07-10 (×6): 50 mg via ORAL
  Filled 2018-07-04 (×8): qty 1

## 2018-07-04 MED ORDER — QUETIAPINE FUMARATE 50 MG PO TABS
250.0000 mg | ORAL_TABLET | Freq: Every day | ORAL | Status: DC
  Administered 2018-07-04: 250 mg via ORAL
  Filled 2018-07-04 (×3): qty 1

## 2018-07-04 NOTE — Progress Notes (Signed)
Patient ID: Robert Shepherd, male   DOB: [birth year], 53 y.o.   MRN: [id number]  Nursing Progress Note [id number]  On initial approach, patient is seen resting in bed. Patient presents flat and depressed and does not engage or brighten much during interactions. Patient compliant with scheduled medications and denies concerns for writer. Patient currently denies SI/HI but does report auditory hallucinations. Dressing to R wrist changed, site appears clean and dry. No redness or drainage noted. New non-adhesive bandage applied; site is clean, dry, intact.   Patient is educated about and provided medication per provider's orders. Patient safety maintained with q15 min safety checks and fall risk precautions. Emotional support given, 1:1 interaction, and active listening provided. Patient encouraged to attend meals, groups, and work on treatment plan and goals. Labs, vital signs and patient behavior monitored throughout shift.   Patient contracts for safety with staff. Patient remains safe on the unit at this time and agrees to come to staff with any issues/concerns. Patient is interacting with peers appropriately on the unit. Will continue to support and monitor.   Patient's self-inventory sheet Rated Energy Level  Low  Rated Sleep  Fair  Rated Appetite  Fair  Rated Anxiety (0-10)  9  Rated Hopelessness (0-10)  8   Rated Depression (0-10)  9  Daily Goal  "getting better"  Any Additional Comments:

## 2018-07-04 NOTE — Progress Notes (Signed)
Recreation Therapy Notes  Date:  3.18.20 Time: 0930 Location: [address] Dayroom  Group Topic: Stress Management  Goal Area(s) Addresses:  Patient will identify positive stress management techniques. Patient will identify benefits of using stress management post d/c.  Intervention:  Stress Management  Activity :  Meditation.  LRT introduced the stress management technique of meditation.  LRT played a meditation that focused on being resilient.  Patients were to listen and follow along as the meditation played to engage.  Education:  Stress Management, Discharge Planning.   Education Outcome: Acknowledges Education  Clinical Observations/Feedback:  Pt did not attend group.     Victorino Sparrow, LRT/CTRS   Ria Comment, Wannetta Langland A 07/04/2018 11:10 AM

## 2018-07-04 NOTE — Tx Team (Signed)
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 acetaminophen (TYLENOL) tablet 650 mg  650 mg Oral Q6H PRN Patrecia Pour, NP      . albuterol (PROVENTIL HFA;VENTOLIN HFA) 108 (90 Base) MCG/ACT inhaler 2 puff  2 puff Inhalation Q6H PRN Patrecia Pour, NP      . alum & mag hydroxide-simeth (MAALOX/MYLANTA) 200-200-20 MG/5ML suspension 30 mL  30 mL Oral Q4H PRN Patrecia Pour, NP      . amLODipine (NORVASC) tablet 10 mg  10 mg Oral Daily Patrecia Pour, NP   10 mg at 07/04/18 0825  . atorvastatin (LIPITOR) tablet 40 mg  40 mg Oral QHS Patrecia Pour, NP   40 mg at 07/03/18 2112  . carvedilol (COREG) tablet 25 mg  25 mg Oral Daily Patrecia Pour, NP   25 mg at 07/04/18 0825  . cloNIDine (CATAPRES) tablet 0.3 mg  0.3 mg Oral TID Patrecia Pour, NP   0.3 mg at 07/04/18 0825  . lisinopril (PRINIVIL,ZESTRIL) tablet 20 mg  20 mg Oral Daily Patrecia Pour, NP   20 mg at 07/04/18 0825  . magnesium hydroxide (MILK OF MAGNESIA) suspension 30 mL  30 mL Oral Daily PRN Patrecia Pour, NP      . neomycin-bacitracin-polymyxin (NEOSPORIN) ointment   Topical TID Sharma Covert, MD      . nicotine (NICODERM CQ - dosed in mg/24 hours) patch 21 mg  21 mg Transdermal Daily Sharma Covert, MD   21 mg at 07/04/18 0826  . QUEtiapine (SEROQUEL) tablet 200 mg  200 mg Oral QHS Sharma Covert, MD   200 mg at 07/03/18 2112  . sertraline (ZOLOFT) tablet 25 mg  25 mg Oral Daily Sharma Covert, MD   25 mg at 07/04/18 0825  . tamsulosin (FLOMAX) capsule 0.4 mg  0.4 mg Oral Daily Patrecia Pour, NP   0.4 mg at 07/04/18 0825   PTA Medications: Medications Prior to Admission  Medication Sig Dispense Refill Last Dose  . acetaminophen (TYLENOL) 325 MG tablet Take 650 mg by mouth every 6 (six) hours as needed for headache (pain).   06/28/2018  . albuterol (PROVENTIL HFA;VENTOLIN HFA) 108 (90 Base) MCG/ACT inhaler Inhale 2 puffs into the lungs every 6 (six) hours as needed for wheezing or shortness of breath.   07/01/2018  . amLODipine (NORVASC) 10 MG tablet Take 1 tablet (10 mg total) by mouth daily. 30 tablet 1 4-5 days ago  . atorvastatin (LIPITOR) 40 MG tablet Take 1 tablet (40 mg total) by mouth daily. (Patient taking differently: Take 40 mg by mouth at bedtime. ) 30 tablet 1 06/30/2018  . carvedilol (COREG) 25 MG tablet Take 1 tablet (25 mg total) by mouth 2 (two) times daily with a meal. (Patient taking differently: Take 25 mg by mouth daily. ) 60 tablet 1 07/01/2018 at 8am  . cloNIDine (CATAPRES) 0.3 MG tablet Take 1 tablet (0.3 mg total) by mouth 3 (three) times daily for 30 days. 90 tablet 0 07/01/2018  . ibuprofen (ADVIL,MOTRIN) 200 MG tablet  Take 400 mg by mouth every 6 (six) hours as needed for headache (pain).   06/29/2018  . lisinopril (PRINIVIL,ZESTRIL) 20 MG tablet Take 1 tablet (20 mg total) by mouth 2 (two) times daily. 60 tablet 0 07/01/2018  . tamsulosin (FLOMAX) 0.4 MG CAPS capsule Take 1 capsule (0.4 mg total) by mouth daily. 30 capsule 1 06/30/2018    Patient Stressors: Financial difficulties Medication change or noncompliance Substance abuse  Patient Strengths: Ability for insight Average or above average intelligence Capable of independent living [employer] of knowledge Motivation for treatment/growth  Treatment Modalities: Medication Management, Group therapy, Case management,  1 to 1 session with clinician, Psychoeducation, Recreational  therapy.   Physician Treatment Plan for Primary Diagnosis: <principal problem not specified> Long Term Goal(s): Improvement in symptoms so as ready for discharge Improvement in symptoms so as ready for discharge   Short Term Goals: Ability to identify changes in lifestyle to reduce recurrence of condition will improve Ability to verbalize feelings will improve Ability to disclose and discuss suicidal ideas Ability to demonstrate self-control will improve Ability to identify and develop effective coping behaviors will improve Ability to maintain clinical measurements within normal limits will improve Compliance with prescribed medications will improve Ability to identify triggers associated with substance abuse/mental health issues will improve Ability to identify changes in lifestyle to reduce recurrence of condition will improve Ability to verbalize feelings will improve Ability to disclose and discuss suicidal ideas Ability to demonstrate self-control will improve Ability to identify and develop effective coping behaviors will improve Ability to maintain clinical measurements within normal limits will improve Compliance with prescribed medications will improve Ability to identify triggers associated with substance abuse/mental health issues will improve  Medication Management: Evaluate patient's response, side effects, and tolerance of medication regimen.  Therapeutic Interventions: 1 to 1 sessions, Unit Group sessions and Medication administration.  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[place])  Long Term Goal(s): Improvement in symptoms so as ready for discharge Improvement in symptoms so as ready for  discharge   Short Term Goals: Ability to identify changes in lifestyle to reduce recurrence of condition will improve Ability to verbalize feelings will improve Ability to disclose and discuss suicidal ideas Ability to demonstrate self-control will improve Ability to identify and develop effective coping behaviors will improve Ability to maintain clinical measurements within normal limits will improve Compliance with prescribed medications will improve Ability to identify triggers associated with substance abuse/mental health issues will improve Ability to identify changes in lifestyle to reduce recurrence of condition will improve Ability to verbalize feelings will improve Ability to disclose and discuss suicidal ideas Ability to demonstrate self-control will improve Ability to identify and develop effective coping behaviors will improve Ability to maintain clinical measurements within normal limits will improve Compliance with prescribed medications will improve Ability to identify triggers associated with substance abuse/mental health issues will improve     Medication Management: Evaluate patient's response, side effects, and tolerance of medication regimen.  Therapeutic Interventions: 1 to 1 sessions, Unit Group sessions and Medication administration.  Evaluation of Outcomes: Progressing   RN Treatment Plan for Primary Diagnosis: <principal problem not specified> Long Term Goal(s): Knowledge of disease and therapeutic regimen to maintain health will improve  Short Term Goals: Ability to verbalize feelings will improve and Ability  to identify and develop effective coping behaviors will improve  Medication Management: RN will administer medications as ordered by provider, will assess and evaluate patient's response and provide education to patient for prescribed medication. RN will report any adverse and/or side effects to prescribing provider.  Therapeutic Interventions: 1 on 1  counseling sessions, Psychoeducation, Medication administration, Evaluate responses to treatment, Monitor vital signs and CBGs as ordered, Perform/monitor CIWA, COWS, AIMS and Fall Risk screenings as ordered, Perform wound care treatments as ordered.  Evaluation of Outcomes: Progressing   LCSW Treatment Plan for Primary Diagnosis: <principal problem not specified> Long Term Goal(s): Safe transition to appropriate next level of care at discharge, Engage patient in therapeutic group addressing interpersonal concerns.  Short Term Goals: Engage patient in aftercare planning with referrals and resources, Increase social support, Increase emotional regulation, Identify triggers associated with mental health/substance abuse issues and Increase skills for wellness and recovery  Therapeutic Interventions: Assess for all discharge needs, 1 to 1 time with Social worker, Explore available resources and support systems, Assess for adequacy in community support network, Educate family and significant other(s) on suicide prevention, Complete Psychosocial Assessment, Interpersonal group therapy.  Evaluation of Outcomes: Progressing   Progress in Treatment: Attending groups: Yes. Participating in groups: Yes. Taking medication as prescribed: Yes. Toleration medication: Yes. Family/Significant other contact made: No, will contact:  Patient unsure if he wants his family to know about his hospitalization. CSW will follow up Patient understands diagnosis: Yes. Discussing patient identified problems/goals with staff: Yes. Medical problems stabilized or resolved: No. Denies suicidal/homicidal ideation: No. Issues/concerns per patient self-inventory: Yes.  New problem(s) identified: Yes, Describe:  patient homeless, limited supports  New Short Term/Long Term Goal(s): detox, medication management for mood stabilization; elimination of SI thoughts; development of comprehensive mental wellness/sobriety  plan.  Patient Goals:  Wants to get stabilized on medications then go into residential treatment  Discharge Plan or Barriers: Has a [facility] intake scheduled for Monday, 07/09/2018.  Reason for Continuation of Hospitalization: Anxiety Depression Hallucinations Suicidal ideation  Estimated Length of Stay: discharge 07/09/2018  Attendees: Patient: Robert Shepherd 07/04/2018 9:19 AM  Physician: Larene Beach 07/04/2018 9:19 AM  Nursing:  07/04/2018 9:19 AM  RN Care Manager: 07/04/2018 9:19 AM  Social Worker: Stephanie Acre, Latanya Presser 07/04/2018 9:19 AM  Recreational Therapist:  07/04/2018 9:19 AM  Other:  07/04/2018 9:19 AM  Other:  07/04/2018 9:19 AM  Other: 07/04/2018 9:19 AM    Scribe for Treatment Team: Joellen Jersey, [place] 07/04/2018 9:19 AM

## 2018-07-04 NOTE — Plan of Care (Signed)
  Problem: Education: Goal: Knowledge of [facility] Education information/materials will improve Outcome: Progressing   Problem: Safety: Goal: Periods of time without injury will increase Outcome: Progressing   

## 2018-07-04 NOTE — Progress Notes (Signed)
[facility] MD Progress Note  07/04/2018 11:15 AM Robert Shepherd  MRN:  [id number] Subjective: Patient reports he is feeling better than he did prior to admission but remains depressed.  He describes partially improved but persistent auditory hallucinations.  States he hears voices telling him that he is worthless and other similar critical statements.  Currently denies suicidal ideations and contracts for safety on unit. Currently does not endorse medication side effects. Objective: I have discussed case with treatment team and have met with patient Patient is a 53 year old male, history of methamphetamine dependence, history of substance-induced mood disorder.  Presented to the hospital reporting depression, suicidal thoughts, hallucinations (reports demeaning/insulting auditory hallucinations and fleeting visual hallucinations).  He reports he had been sober x7 months until recent relapse.  He had been treated with Seroquel in the past but was not taking prior to admission.   Today patient acknowledges some improvement compared to how he felt prior to admission, but describes lingering depression and persistent (although decreased) auditory hallucinations.  Of note he does not currently appear internally preoccupied, no delusions are expressed, no thought disorder is noted. He describes persistent depression, affect does appear vaguely constricted, although smiles briefly at times during session.  Today he denies suicidal ideations, and contracts for safety on unit.  Currently denies medication side effects.  He has been restarted on Seroquel, which she had been on in the past. No disruptive or agitated behaviors on unit, limited milieu/group participation at this time.  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 Cancer ([place])   . Hypertension   . Renal disorder     Past Surgical History:  Procedure Laterality Date  . CARDIAC SURGERY    . CYSTOSCOPY    . LITHOTRIPSY     Family History: History reviewed. No pertinent family history. Family Psychiatric  History:  Social History:  Social History   Substance and Sexual Activity  Alcohol Use Not Currently     Social History   Substance and Sexual Activity  Drug Use Yes    Social History   Socioeconomic History  . Marital status: Single    Spouse name: Not on file  . Number of children: Not on file  . Years of education: Not on file  . Highest education level: Not on file  Occupational History  . Not on file  Social Needs  . Financial resource strain: Not on file  . Food insecurity:    Worry: Not on file    Inability: Not on file  . Transportation needs:    Medical: Not on file    Non-medical: Not on file  Tobacco Use  . Smoking status: Current Every Day Smoker    Types: E-cigarettes  . Smokeless tobacco: Former Systems developer    Types: [place] date: 05/12/2008  Substance and Sexual Activity  . Alcohol use: Not Currently  . Drug use: Yes  . Sexual activity: Not Currently  Lifestyle  . Physical activity:    Days per week: Not on file    Minutes per session: Not on file  . Stress: Not on  file  Relationships  . Social connections:    Talks on phone: Not on file    Gets together: Not on file    Attends religious service: Not on file    Active member of club or organization: Not on file    Attends meetings of clubs or organizations: Not on file    Relationship status: Not on file  Other Topics Concern  . Not on file  Social History Narrative  . Not on file    Additional Social History:    Sleep: Fair  Appetite:  Fair  Current Medications: Current Facility-Administered Medications  Medication Dose Route Frequency Provider Last Rate Last Dose  . acetaminophen (TYLENOL) tablet 650 mg  650 mg Oral Q6H PRN Patrecia Pour, NP      . albuterol (PROVENTIL HFA;VENTOLIN HFA) 108 (90 Base) MCG/ACT inhaler 2 puff  2 puff Inhalation Q6H PRN Patrecia Pour, NP      . alum & mag hydroxide-simeth (MAALOX/MYLANTA) 200-200-20 MG/5ML suspension 30 mL  30 mL Oral Q4H PRN Patrecia Pour, NP      . amLODipine (NORVASC) tablet 10 mg  10 mg Oral Daily Patrecia Pour, NP   10 mg at 07/04/18 0825  . atorvastatin (LIPITOR) tablet 40 mg  40 mg Oral QHS Patrecia Pour, NP   40 mg at 07/03/18 2112  . carvedilol (COREG) tablet 25 mg  25 mg Oral Daily Patrecia Pour, NP   25 mg at 07/04/18 0825  . cloNIDine (CATAPRES) tablet 0.3 mg  0.3 mg Oral TID Patrecia Pour, NP   0.3 mg at 07/04/18 0825  . lisinopril (PRINIVIL,ZESTRIL) tablet 20 mg  20 mg Oral Daily Patrecia Pour, NP   20 mg at 07/04/18 0825  . magnesium hydroxide (MILK OF MAGNESIA) suspension 30 mL  30 mL Oral Daily PRN Patrecia Pour, NP      . neomycin-bacitracin-polymyxin (NEOSPORIN) ointment   Topical TID Sharma Covert, MD      . nicotine (NICODERM CQ - dosed in mg/24 hours) patch 21 mg  21 mg Transdermal Daily Sharma Covert, MD   21 mg at 07/04/18 0826  . QUEtiapine (SEROQUEL) tablet 200 mg  200 mg Oral QHS Sharma Covert, MD   200 mg at 07/03/18 2112  . sertraline (ZOLOFT) tablet 25 mg  25 mg Oral Daily Sharma Covert, MD   25 mg at 07/04/18 0825  . tamsulosin (FLOMAX) capsule 0.4 mg  0.4 mg Oral Daily Patrecia Pour, NP   0.4 mg at 07/04/18 0825    Lab Results:  Results for orders placed or performed during the hospital encounter of 07/02/18 (from the past 48 hour(s))  Basic metabolic panel     Status: Abnormal   Collection Time: 07/04/18  6:09 AM  Result Value Ref Range    Sodium 140 135 - 145 mmol/L   Potassium 3.9 3.5 - 5.1 mmol/L   Chloride 111 98 - 111 mmol/L   CO2 21 (L) 22 - 32 mmol/L   Glucose, Bld 95 70 - 99 mg/dL   BUN 29 (H) 6 - 20 mg/dL   Creatinine, Ser 1.33 (H) 0.61 - 1.24 mg/dL   Calcium 9.0 8.9 - 10.3 mg/dL   GFR calc non Af Amer >60 >60 mL/min   GFR calc Af Amer >60 >60 mL/min   Anion gap 8 5 - 15    Comment: Performed at [facility], [address] [place],  [place] [address]  Brain natriuretic peptide     Status: None   Collection Time: 07/04/18  6:09 AM  Result Value Ref Range   B Natriuretic Peptide 37.3 0.0 - 100.0 pg/mL    Comment: Performed at [facility], [place] [address], [place], [place] [address]    Blood Alcohol level:  Lab Results  Component Value Date   ETH <10 74/94/4967    Metabolic Disorder Labs: No results found for: HGBA1C, MPG No results found for: PROLACTIN No results found for: CHOL, TRIG, HDL, CHOLHDL, VLDL, LDLCALC  Physical Findings: AIMS: Facial and Oral Movements Muscles of Facial Expression: None, normal Lips and Perioral Area: None, normal Jaw: None, normal Tongue: None, normal,Extremity Movements Upper (arms, wrists, hands, fingers): None, normal Lower (legs, knees, ankles, toes): None, normal, Trunk Movements Neck, shoulders, hips: None, normal, Overall Severity Severity of abnormal movements (highest score from questions above): None, normal Incapacitation due to abnormal movements: None, normal Patient's awareness of abnormal movements (rate only patient's report): No Awareness, Dental Status Current problems with teeth and/or dentures?: No Does patient usually wear dentures?: No  CIWA:    COWS:     Musculoskeletal: Strength & Muscle Tone: within normal limits Gait & Station: normal Patient leans: N/A  Psychiatric Specialty Exam: Physical Exam  ROS-no chest pain, no shortness of breath, no vomiting, no fever, no chills  Blood pressure (!)  115/101, pulse 72, temperature 98.3 F (36.8 C), temperature source Oral, resp. rate 20, height _0  (1.626 m), weight 67.1 kg.Body mass index is 25.4 kg/m.  General Appearance: Fairly Groomed  Eye Contact:  Fair  Speech:  Normal Rate  Volume:  Decreased  Mood:  Depressed and vaguely dysphoric  Affect:  Restricted although does smile briefly at times  Thought Process:  Linear and Descriptions of Associations: Intact  Orientation:  Other:  Fully alert and attentive  Thought Content:  Describes partially improved but lingering auditory hallucinations.  Does not currently appear internally preoccupied.  No delusions are expressed.  Suicidal Thoughts:  No currently  denies suicidal plan or intention and contracts for safety on unit  Homicidal Thoughts:  No denies homicidal or violent ideations  Memory:  Recent and remote fair  Judgement:  Fair  Insight:  Fair  Psychomotor Activity:  Decreased-no current psychomotor agitation or restlessness noted  Concentration:  Concentration: Fair and Attention Span: Fair  Recall:  [employer] of Knowledge:  Fair  Language:  Fair  Akathisia:  Negative  Handed:  Right  AIMS (if indicated):     Assets:  Desire for Improvement Resilience  ADL's:  Intact  Cognition:  WNL  Sleep:  Number of Hours: 6.25   Assessment:  Patient is a 53 year old male, history of methamphetamine dependence, history of substance-induced mood disorder.  Presented to the hospital reporting depression, suicidal thoughts, hallucinations (reports demeaning/insulting auditory hallucinations and fleeting visual hallucinations).  He reports he had been sober x7 months until recent relapse.  He had been treated with Seroquel in the past but was not taking prior to admission.   Patient reports some improvement compared to admission but remains depressed, dysphoric, subtly irritable, and endorses some persistent auditory hallucinations although currently does not appear internally  preoccupied.  He is denying suicidal ideations and does present future oriented, expressing interest in going to a rehab at discharge.  He is tolerating Seroquel trial well thus far.  Of note, patient has a history of hypertension-he has been restarted on home Antihypertensive regimen-Norvasc, Coreg,  Clonidine  Treatment Plan Summary: Daily contact with patient to assess and evaluate symptoms and progress in treatment, Medication management, Plan Inpatient treatment and Medications as below Encourage group and milieu participation to work on coping skills and symptom reduction Encourage efforts to work on sobriety and relapse prevention Continue Seroquel at 250 mg nightly for psychosis/mood disorder Continue Zoloft at 50 mg daily for depression Continue Norvasc, Coreg, Clonidine for management of hypertension Continue Lipitor for management of hypercholesterolemia Treatment team working on disposition planning options-of note, CSW reports there might be an available bed at [facility] tomorrow in a.m., but as reviewed with team and patient, it is felt he is still in need/benefiting from current level of care (inpatient psychiatric admission).  Check lipid panel, hemoglobin A1c-routine as on Seroquel. Jenne Campus, MD 3/18/2 020, 11:15 AM

## 2018-07-04 NOTE — Progress Notes (Signed)
[facility] Group Notes:  (Nursing/MHT/Case Management/Adjunct)  Date:  07/04/2018  Time:  2045 Type of Therapy:  wrap up group  Participation Level:  Active  Participation Quality:  Appropriate, Attentive and Sharing  Affect:  Depressed and Flat  Cognitive:  Appropriate  Insight:  Improving  Engagement in Group:  Engaged  Modes of Intervention:  Clarification, Education and Support  Summary of Progress/Problems: Pt shared his positive today was being alive to see the sunrise. If pt could change any one thing it would be his drug use. First drug used at age 74. Pt is grateful for his daughter.   Shellia Cleverly 07/04/2018, 10:37 PM

## 2018-07-04 NOTE — Progress Notes (Signed)
Patient has a rescheduled [facility] intake appointment on Monday, 07/09/2018  Patient's discharge orders will need to be in on 07/08/2018 after 3pm  Patient will need to take a taxi at 7:00am from [facility] on 07/09/2018  Patient will need a 28 day supply of medication   Stephanie Acre, [place] Social Worker

## 2018-07-05 ENCOUNTER — Inpatient Hospital Stay (HOSPITAL_COMMUNITY): Payer: Federal, State, Local not specified - Other

## 2018-07-05 ENCOUNTER — Encounter (HOSPITAL_COMMUNITY): Payer: Self-pay | Admitting: *Deleted

## 2018-07-05 ENCOUNTER — Other Ambulatory Visit: Payer: Self-pay

## 2018-07-05 LAB — CBC WITH DIFFERENTIAL/PLATELET
Abs Immature Granulocytes: 0.02 10*3/uL (ref 0.00–0.07)
Basophils Absolute: 0.1 10*3/uL (ref 0.0–0.1)
Basophils Relative: 1 %
Eosinophils Absolute: 0.3 10*3/uL (ref 0.0–0.5)
Eosinophils Relative: 5 %
HCT: 44.9 % (ref 39.0–52.0)
Hemoglobin: 14.9 g/dL (ref 13.0–17.0)
Immature Granulocytes: 0 %
Lymphocytes Relative: 21 %
Lymphs Abs: 1.4 10*3/uL (ref 0.7–4.0)
MCH: 28.9 pg (ref 26.0–34.0)
MCHC: 33.2 g/dL (ref 30.0–36.0)
MCV: 87.2 fL (ref 80.0–100.0)
Monocytes Absolute: 0.4 10*3/uL (ref 0.1–1.0)
Monocytes Relative: 6 %
Neutro Abs: 4.4 10*3/uL (ref 1.7–7.7)
Neutrophils Relative %: 67 %
Platelets: 231 10*3/uL (ref 150–400)
RBC: 5.15 MIL/uL (ref 4.22–5.81)
RDW: 12.9 % (ref 11.5–15.5)
WBC: 6.6 10*3/uL (ref 4.0–10.5)
nRBC: 0 % (ref 0.0–0.2)

## 2018-07-05 LAB — BASIC METABOLIC PANEL
Anion gap: 6 (ref 5–15)
BUN: 29 mg/dL — ABNORMAL HIGH (ref 6–20)
CO2: 25 mmol/L (ref 22–32)
Calcium: 9.6 mg/dL (ref 8.9–10.3)
Chloride: 109 mmol/L (ref 98–111)
Creatinine, Ser: 1.35 mg/dL — ABNORMAL HIGH (ref 0.61–1.24)
GFR, EST NON AFRICAN AMERICAN: 60 mL/min — AB (ref >60)
Glucose, Bld: 129 mg/dL — ABNORMAL HIGH (ref 70–99)
Potassium: 4.6 mmol/L (ref 3.5–5.1)
Sodium: 140 mmol/L (ref 135–145)

## 2018-07-05 LAB — TSH: TSH: 0.396 u[IU]/mL (ref 0.350–4.500)

## 2018-07-05 LAB — CK: Total CK: 47 U/L — ABNORMAL LOW (ref 49–397)

## 2018-07-05 LAB — CBG MONITORING, ED: Glucose-Capillary: 88 mg/dL (ref 70–99)

## 2018-07-05 LAB — AMMONIA: Ammonia: 68 umol/L — ABNORMAL HIGH (ref 9–35)

## 2018-07-05 LAB — HEMOGLOBIN A1C
Hgb A1c MFr Bld: 5.3 % (ref 4.8–5.6)
Mean Plasma Glucose: 105.41 mg/dL

## 2018-07-05 LAB — LIPID PANEL
Cholesterol: 162 mg/dL (ref 0–200)
HDL: 35 mg/dL — ABNORMAL LOW (ref >40)
LDL Cholesterol: 102 mg/dL — ABNORMAL HIGH (ref 0–99)
Total CHOL/HDL Ratio: 4.6 RATIO
Triglycerides: 127 mg/dL (ref <150)
VLDL: 25 mg/dL (ref 0–40)

## 2018-07-05 MED ORDER — LORAZEPAM 0.5 MG PO TABS
0.5000 mg | ORAL_TABLET | Freq: Four times a day (QID) | ORAL | Status: DC | PRN
  Administered 2018-07-05 – 2018-07-10 (×6): 0.5 mg via ORAL
  Filled 2018-07-05 (×6): qty 1

## 2018-07-05 MED ORDER — QUETIAPINE FUMARATE 300 MG PO TABS
300.0000 mg | ORAL_TABLET | Freq: Every day | ORAL | Status: DC
  Administered 2018-07-05: 300 mg via ORAL
  Filled 2018-07-05 (×3): qty 1

## 2018-07-05 NOTE — ED Triage Notes (Signed)
Pt arrives via [facility] from [facility] with "behavioral changes over the past 24 hours". Pt has had increased hallucinations and paranoia. 150/92, hr 86, saturations 100%.

## 2018-07-05 NOTE — Progress Notes (Signed)
Patient found in his bathroom by the MHT this evening at the start of the shift. MHT noted that he was staying in the bathroom for an extended amount of time and reported this to this RN. MHT checked on patient three times in 30 minutes and asked him to come down the hall to talk to this RN. MHT reported that the patient didn't have any pants on and his shirt was covering his genitals, she provided him paper scrubs. He said he would come down the hall but after 5 minutes passed and he still didn't leave the bathroom this RN went to check on him. Patient was found in the restroom standing by the wall with the paper towels. Patient did have the paper scrubs on. Patient would not come out of the bathroom when asked. Patient had the toilet paper beside the paper towels and will not let this RN see in the corner. Patient appears to be hallucinating. This RN asked the patient if he was having any physical symptom like nausea or diarrhea. The patient nodded yes when I said diarrhea. Patient denies SI and HI. Patient nodded yes to hearing and seeing things that aren't there. This RN asked if the patient could come down the hall to take medications for his symptoms and patient refused to come out of the bathroom. This RN brought PRN ativan to the patients room and found the patient had draped toilet paper over the door and turned the bathroom light off. He could not explain why he had done so nor would he let me turn the light back on. Patient took his ativan and this RN notified the charge nurse because I had him last night and this is appears to be a significant status change. Last night the patient was dressed and out of his room in the milieu. Patient has been endorsing auditory hallucinations described as "can't tell what they're saying" this admission and only other symptoms of depression and elevated blood pressure before this evening. Charge nurse Erlene Quan came down to the patients room to assess him and the patient  still wouldn't come out of the bathroom nor let us look in the corner. This RN pulled the patients dose of seroquel early and administered it. Patient was observed picking at the dressing over his sutures on his right wrist trying to take it off. This RN asked him to leave the dressing in place and he did. More staff came down to try to see if we could get him out of the bathroom to search it. He let us turn the light on and he showed [place] the paper towels and toilet paper but wouldn't let us hold it and he still wouldn't come out of the bathroom. It was noted that his jeans and underwear that were on the ground had been urinated on. Patient admitted to drinking beer before admission by nodding when asked. Patient has denied alcohol use up to this point. With this information there was then a concern of DTs. The on call provider was contacted and he gave order to call EMS to transfer the patient to the ED for evaluation. Blood pressure was elevated and all other vitals in normal limits. Patient was offered snack and drink. He did drink some cranberry juice. Patient has not been aggressive but looks scared and was insistent with his body language he was not going to leave the corner of the bathroom even when asked to. After this RN explained that we were concerned about  his safety and needed him to be checked at the medical hospital he came out of the restroom and went with the EMTs.  AVH, paranoia, mutism, new admitting alcohol use so no withdrawal protocols had been in place, and elevated blood pressure were all indicators of a status change.

## 2018-07-05 NOTE — Progress Notes (Signed)
I was called to comment on patient's antihypertensive regimen. He has a longstanding history of HTN as well as CAD with history of CABG and amphetamine abuse. Does not appear to abuse cocaine. BPs have been elevated (particularly DBP) but improving with restart of home medications including norvasc 10mg , coreg 25mg  BID, clonidine 0.3mg  TID, lisinopril 20mg .   Pt is at max dose of all but lisinopril and has had no hypotensive episodes, though most recent BP 108/84 is considerably lower than previous. With MAP still >80, I'm inclined to make no changes at this time.  - I would recommend checking manually any BP that has SBP >184mmHg or DBP >133mmHg.  - If readings remain elevated, would recommend starting hydralazine 25mg  TID and titrating as needed to maintain BP < 180/100.  - This was discussed with Dr. Parke Poisson. The hospitalist team would be happy to answer any further questions should they arise. The appropriate pager number for provider on call would be [phone].  Vance Gather, MD 07/05/2018 5:02 PM

## 2018-07-05 NOTE — ED Notes (Signed)
Patient aware that we need urine sample for testing, unable at this time. Pt given instruction on providing urine sample when able to do so. Urinal at bedside

## 2018-07-05 NOTE — ED Provider Notes (Signed)
[facility] DEPT Provider Note   CSN: [id number] Arrival date & time: 07/05/18  2114    History   Chief Complaint Chief Complaint  Patient presents with  . Robert Shepherd    HPI [name] is a 53 y.o. male  Patient was sent to the emergency department from behavioral health for apparent worsening Robert Shepherd.  History is gathered from review of the EMR, and nursing triage notes.  I spoke with our charge nurse Christiane Ha who states that nursing staff reported he is more confused, hallucinating and paranoid.  They were afraid he might be "in DTs."  The patient is currently oriented to person place and time.  He is somnolent.  He states that he does not know why he is here.  He denies Robert Shepherd.  He denies fevers or chills.  He states he has a mild headache without neck stiffness or rashes.  Patient does not use alcohol.     HPI  Past Medical History:  Diagnosis Date  . Cancer ([place])   . Hypertension   . Renal disorder     Patient Active Problem List   Diagnosis Date Noted  . 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)   . 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 Hypertensive urgency 05/12/2018    Past Surgical History:  Procedure Laterality Date  . CARDIAC SURGERY    . CYSTOSCOPY    . LITHOTRIPSY          Home Medications    Prior to Admission medications   Medication Sig Start Date End Date Taking? Authorizing Provider  acetaminophen (TYLENOL) 325 MG tablet Take 650 mg by mouth every 6 (six) hours as needed for headache (pain).    [provider]  albuterol (PROVENTIL HFA;VENTOLIN HFA) 108 (90 Base) MCG/ACT inhaler Inhale 2 puffs into the lungs every 6 (six) hours as needed for wheezing or shortness of breath.    [provider]  amLODipine (NORVASC) 10 MG tablet Take 1 tablet (10 mg total) by mouth daily. 05/14/18   Geradine Girt, DO  atorvastatin (LIPITOR) 40 MG tablet Take 1 tablet (40 mg total) by mouth daily. Patient taking differently: Take 40 mg by mouth at bedtime.  05/13/18   Geradine Girt, DO  carvedilol (COREG) 25 MG tablet Take 1 tablet (25 mg total) by mouth 2 (two) times daily with a meal. Patient taking differently: Take 25 mg by mouth daily.  05/13/18   Geradine Girt, DO  cloNIDine (CATAPRES) 0.3 MG tablet Take 1 tablet (0.3 mg total) by mouth 3 (three) times daily for 30 days. 06/30/18 07/30/18  Little, Wenda Overland, MD  ibuprofen (ADVIL,MOTRIN) 200 MG tablet Take 400 mg by mouth every 6 (six) hours as needed for headache (pain).    [provider]  lisinopril (PRINIVIL,ZESTRIL) 20 MG tablet Take 1 tablet (20 mg total) by mouth 2 (two) times daily. 05/13/18   Geradine Girt, DO  tamsulosin (FLOMAX) 0.4 MG CAPS capsule Take 1 capsule (0.4 mg total) by mouth daily. 05/13/18   Geradine Girt, DO    Family History History reviewed. No pertinent family history.  Social History Social History   Tobacco Use  . Smoking status: Current Every Day Smoker    Types: E-cigarettes  . Smokeless tobacco: Former Systems developer    Types: Loss adjuster, chartered  Quit date: 05/12/2008  Substance Use Topics  . Alcohol use: Not Currently  . Drug use: Yes     Allergies   Toradol [ketorolac tromethamine] and Sulfa antibiotics   Review of Systems Review of Systems Ten systems reviewed and are negative for acute change, except as noted in the HPI.    Physical Exam Updated Vital Signs BP (!) 147/99 (BP Location: Left Arm)   Pulse 80   Temp 98.1 F (36.7 C) (Oral)   Resp 16   Ht 5\' 4"  (1.626 m)   Wt 68 kg   SpO2 97%   BMI 25.75 kg/m   Physical Exam Vitals signs and nursing note reviewed.  Constitutional:      General: He is not in acute distress.    Appearance: He is well-developed. He is not  diaphoretic.  HENT:     Head: Normocephalic and atraumatic.  Eyes:     General: No scleral icterus.    Conjunctiva/sclera: Conjunctivae normal.  Neck:     Musculoskeletal: Normal range of motion and neck supple.  Cardiovascular:     Rate and Rhythm: Normal rate and regular rhythm.     Heart sounds: Normal heart sounds.  Pulmonary:     Effort: Pulmonary effort is normal. No respiratory distress.     Breath sounds: Normal breath sounds.  Abdominal:     Palpations: Abdomen is soft.     Tenderness: There is no abdominal tenderness.  Skin:    General: Skin is warm and dry.  Neurological:     Mental Status: He is easily aroused. He is lethargic.  Psychiatric:        Behavior: Behavior normal.      ED Treatments / Results  Labs (all labs ordered are listed, but only abnormal results are displayed) Labs Reviewed  BASIC METABOLIC PANEL - Abnormal; Notable for the following components:      Result Value   CO2 21 (*)    BUN 29 (*)    Creatinine, Ser 1.33 (*)    All other components within normal limits  LIPID PANEL - Abnormal; Notable for the following components:   HDL 35 (*)    LDL Cholesterol 102 (*)    All other components within normal limits  BASIC METABOLIC PANEL - Abnormal; Notable for the following components:   Glucose, Bld 129 (*)    BUN 29 (*)    Creatinine, Ser 1.35 (*)    GFR calc non Af Amer 60 (*)    All other components within normal limits  CK - Abnormal; Notable for the following components:   Total CK 47 (*)    All other components within normal limits  BRAIN NATRIURETIC PEPTIDE  HEMOGLOBIN A1C  TSH  CBC WITH DIFFERENTIAL/PLATELET    EKG EKG Interpretation  Date/Time:  Thursday July 05 2018 23:06:14 EDT Ventricular Rate:  88 PR Interval:    QRS Duration: 98 QT Interval:  338 QTC Calculation: 409 R Axis:   63 Text Interpretation:  Sinus rhythm Probable LVH with secondary repol abnrm Confirmed by Quintella Reichert [phone]) on 07/05/2018 11:21:47  PM   Radiology No results found.  Procedures Procedures (including critical care time)  Medications Ordered in ED Medications  alum & mag hydroxide-simeth (MAALOX/MYLANTA) 200-200-20 MG/5ML suspension 30 mL (has no administration in time range)  magnesium hydroxide (MILK OF MAGNESIA) suspension 30 mL (has no administration in time range)  acetaminophen (TYLENOL) tablet 650 mg (has no administration in time range)  albuterol (PROVENTIL HFA;VENTOLIN HFA)  108 (90 Base) MCG/ACT inhaler 2 puff (has no administration in time range)  amLODipine (NORVASC) tablet 10 mg (10 mg Oral Given 07/05/18 1211)  atorvastatin (LIPITOR) tablet 40 mg (40 mg Oral Given 07/04/18 2103)  carvedilol (COREG) tablet 25 mg (25 mg Oral Given 07/05/18 1210)  cloNIDine (CATAPRES) tablet 0.3 mg (0.3 mg Oral Not Given 07/05/18 1658)  lisinopril (PRINIVIL,ZESTRIL) tablet 20 mg (20 mg Oral Given 07/05/18 1212)  tamsulosin (FLOMAX) capsule 0.4 mg (0.4 mg Oral Given 07/05/18 1210)  nicotine (NICODERM CQ - dosed in mg/24 hours) patch 21 mg (21 mg Transdermal Patch Removed 07/05/18 1216)  neomycin-bacitracin-polymyxin (NEOSPORIN) ointment ( Topical Given 07/05/18 1735)  sertraline (ZOLOFT) tablet 50 mg (50 mg Oral Given 07/05/18 1211)  LORazepam (ATIVAN) tablet 0.5 mg (0.5 mg Oral Given 07/05/18 2014)  QUEtiapine (SEROQUEL) tablet 300 mg (300 mg Oral Given 07/05/18 2035)  Influenza vac split quadrivalent PF (FLUARIX) injection 0.5 mL (0.5 mLs Intramuscular Given 07/03/18 1148)     Initial Impression / Assessment and Plan / ED Course  I have reviewed the triage vital signs and the nursing notes.  Pertinent labs & imaging results that were available during my care of the patient were reviewed by me and considered in my medical decision making (see chart for details).        Currently somnolent.  He was given Seroquel and Ativan just prior to arrival for his work-up for altered mental status.  This is clearly going to interfere with  some of his cognition and wakefulness. Patient has mild hyperammonemia I do not think that this is making him encephalopathic.  CBG is 88.  BMP shows elevated BUN and creatinine however this appears stable and baseline creatinine for the patient.  I personally reviewed the patient's CT head which shows no acute abnormalities.  Patient's EKG is unchanged from previous tracings. His UA is currently pending.  I given signout to [place].  Expect return to inpatient behavioral health pending urinalysis.  Final Clinical Impressions(s) / ED Diagnoses   Final diagnoses:  None    ED Discharge Orders    None       Margarita Mail, PA-C 07/06/18 0023    Quintella Reichert, MD 07/06/18 [phone]

## 2018-07-05 NOTE — Plan of Care (Signed)
  Problem: Activity: Goal: Interest or engagement in activities will improve Outcome: Not Progressing   Problem: Safety: Goal: Periods of time without injury will increase Outcome: Progressing  Dar Note: Patient presents with irritable affect and mood.  Stayed in his room for majority of this shift.  Needed a lot of encouragements before taking his medication.  Denies SI, auditory and visual hallucinations.  Offered support and encouragement as needed.  Routine safety checks maintained every 15 minutes.  Food and fluid intake encouraged due to poor appetite.  Patient is safe on the unit.

## 2018-07-05 NOTE — Plan of Care (Signed)
D: Patient is alert and cooperative. Denies SI, HI, VH, and verbally contracts for safety. Still endorses inaudible auditory hallucinations. Patient denies physical symptoms/pain. Patient endorses feeling down/depressed.    A: Medications administered per MD order. Support provided. Patient educated on safety on the unit and medications. Routine safety checks every 15 minutes. Patient stated understanding to tell nurse about any new physical symptoms. Patient understands to tell staff of any needs.     R: No adverse drug reactions noted. Patient verbally contracts for safety. Patient remains safe at this time and will continue to monitor.   Problem: Medication: Goal: Compliance with prescribed medication regimen will improve Outcome: Progressing   Problem: Safety: Goal: Ability to remain free from injury will improve Outcome: Progressing   Patient is willing to take medication as prescribed. Patient remains safe and will continue to monitor.

## 2018-07-05 NOTE — Progress Notes (Addendum)
[facility] MD Progress Note  07/04/2018 11:15 AM Yurem Viner  MRN:  [id number] Subjective: Patient does not currently describe any specific concerns or issues.  Does acknowledge persistent depression.  Denies suicidal ideations.  Objective: I have discussed case with treatment team and have met with patient Patient is a 53 year old male, history of methamphetamine dependence, history of substance-induced mood disorder.  Presented to the hospital reporting depression, suicidal thoughts, hallucinations (reports demeaning/insulting auditory hallucinations and fleeting visual hallucinations).  He reports he had been sober x7 months until recent relapse.  He had been treated with Seroquel in the past but was not taking prior to admission.   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  At the beginning of session does make some verbal remarks as to how he is feeling but then become selectively mute.  The same pattern has been observed with other staff/ RNs.  Of note patient is alert and does make facial movements such as nodding more shrugging which demonstrate that he is understanding and following what is being said. For example, accurately relayed Seroquel dosage using his fingers to denote quantity.  He also, as above, occasionally answer with short phrases.  Report from staff is that patient has presented irritable, but without overt agitation.  He has needed encouragement to comply with medication but has taken it.  There is no current cogwheeling or any noticed stiffness or rigidity. No lateralization is noted at this time.  He has a history of hypertension-blood pressure has decreased noticeably and most recently was 108/84 with a pulse of 65.  He has been afebrile. I have reviewed current antihypertensive regimen and vitals/history with hospitalist consultant.  The recommendation at this time is to continue current antihypertensive treatment regimen.  Patient presents calm and in no acute distress,  there is no psychomotor agitation, sits comfortably throughout the session.  Labs reviewed-TSH 0.396, hemoglobin A1c 5.3, lipid panel unremarkable, serum glucose 105.41 Creatinine has improved from 2.02 down to 1.33 yesterday.   Principal Problem: Substance (stimulant) dependence, substance-induced mood disorder, substance-induced psychosis. 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 Cancer ([place])   . Hypertension   . Renal disorder     Past Surgical History:  Procedure Laterality Date  . CARDIAC SURGERY    . CYSTOSCOPY    . LITHOTRIPSY     Family History: History reviewed. No pertinent family history. Family Psychiatric  History:  Social History:  Social History   Substance and Sexual Activity  Alcohol Use Not Currently     Social History   Substance and Sexual Activity  Drug Use Yes    Social History   Socioeconomic History  . Marital status: Single    Spouse name: Not on file  . Number of children: Not on file  . Years of education: Not on file  . Highest education level: Not on file  Occupational History  . Not on file  Social Needs  . Financial resource strain: Not on file  . Food insecurity:    Worry: Not on file    Inability: Not on file  . Transportation needs:    Medical: Not on file    Non-medical: Not on file  Tobacco Use  .  Smoking status: Current Every Day Smoker    Types: E-cigarettes  . Smokeless tobacco: Former Systems developer    Types: Muse date: 05/12/2008  Substance and Sexual Activity  . Alcohol use: Not Currently  . Drug use: Yes  . Sexual activity: Not Currently  Lifestyle  . Physical activity:     Days per week: Not on file    Minutes per session: Not on file  . Stress: Not on file  Relationships  . Social connections:    Talks on phone: Not on file    Gets together: Not on file    Attends religious service: Not on file    Active member of club or organization: Not on file    Attends meetings of clubs or organizations: Not on file    Relationship status: Not on file  Other Topics Concern  . Not on file  Social History Narrative  . Not on file   Additional Social History:    Sleep: Fair  Appetite:  Fair  Current Medications: Current Facility-Administered Medications  Medication Dose Route Frequency Provider Last Rate Last Dose  . acetaminophen (TYLENOL) tablet 650 mg  650 mg Oral Q6H PRN Patrecia Pour, NP      . albuterol (PROVENTIL HFA;VENTOLIN HFA) 108 (90 Base) MCG/ACT inhaler 2 puff  2 puff Inhalation Q6H PRN Patrecia Pour, NP      . alum & mag hydroxide-simeth (MAALOX/MYLANTA) 200-200-20 MG/5ML suspension 30 mL  30 mL Oral Q4H PRN Patrecia Pour, NP      . amLODipine (NORVASC) tablet 10 mg  10 mg Oral Daily Patrecia Pour, NP   10 mg at 07/04/18 0825  . atorvastatin (LIPITOR) tablet 40 mg  40 mg Oral QHS Patrecia Pour, NP   40 mg at 07/03/18 2112  . carvedilol (COREG) tablet 25 mg  25 mg Oral Daily Patrecia Pour, NP   25 mg at 07/04/18 0825  . cloNIDine (CATAPRES) tablet 0.3 mg  0.3 mg Oral TID Patrecia Pour, NP   0.3 mg at 07/04/18 0825  . lisinopril (PRINIVIL,ZESTRIL) tablet 20 mg  20 mg Oral Daily Patrecia Pour, NP   20 mg at 07/04/18 0825  . magnesium hydroxide (MILK OF MAGNESIA) suspension 30 mL  30 mL Oral Daily PRN Patrecia Pour, NP      . neomycin-bacitracin-polymyxin (NEOSPORIN) ointment   Topical TID Sharma Covert, MD      . nicotine (NICODERM CQ - dosed in mg/24 hours) patch 21 mg  21 mg Transdermal Daily Sharma Covert, MD   21 mg at 07/04/18 0826  . QUEtiapine (SEROQUEL) tablet 200 mg  200 mg Oral QHS Sharma Covert, MD    200 mg at 07/03/18 2112  . sertraline (ZOLOFT) tablet 25 mg  25 mg Oral Daily Sharma Covert, MD   25 mg at 07/04/18 0825  . tamsulosin (FLOMAX) capsule 0.4 mg  0.4 mg Oral Daily Patrecia Pour, NP   0.4 mg at 07/04/18 0825    Lab Results:  Results for orders placed or performed during the hospital encounter of 07/02/18 (from the past 48 hour(s))  Basic metabolic panel     Status: Abnormal   Collection Time: 07/04/18  6:09 AM  Result Value Ref Range   Sodium 140 135 - 145 mmol/L   Potassium 3.9 3.5 - 5.1 mmol/L   Chloride 111 98 - 111 mmol/L   CO2 21 (L) 22 - 32  mmol/L   Glucose, Bld 95 70 - 99 mg/dL   BUN 29 (H) 6 - 20 mg/dL   Creatinine, Ser 1.33 (H) 0.61 - 1.24 mg/dL   Calcium 9.0 8.9 - 10.3 mg/dL   GFR calc non Af Amer >60 >60 mL/min   GFR calc Af Amer >60 >60 mL/min   Anion gap 8 5 - 15    Comment: Performed at [facility], [place] [address] [place], [place] [address]  Brain natriuretic peptide     Status: None   Collection Time: 07/04/18  6:09 AM  Result Value Ref Range   B Natriuretic Peptide 37.3 0.0 - 100.0 pg/mL    Comment: Performed at [facility], [place] [address], [place], [place] [address]    Blood Alcohol level:  Lab Results  Component Value Date   ETH <10 60/63/0160    Metabolic Disorder Labs: No results found for: HGBA1C, MPG No results found for: PROLACTIN No results found for: CHOL, TRIG, HDL, CHOLHDL, VLDL, LDLCALC  Physical Findings: AIMS: Facial and Oral Movements Muscles of Facial Expression: None, normal Lips and Perioral Area: None, normal Jaw: None, normal Tongue: None, normal,Extremity Movements Upper (arms, wrists, hands, fingers): None, normal Lower (legs, knees, ankles, toes): None, normal, Trunk Movements Neck, shoulders, hips: None, normal, Overall Severity Severity of abnormal movements (highest score from questions above): None, normal Incapacitation due to abnormal movements: None,  normal Patient's awareness of abnormal movements (rate only patient's report): No Awareness, Dental Status Current problems with teeth and/or dentures?: No Does patient usually wear dentures?: No  CIWA:    COWS:     Musculoskeletal: Strength & Muscle Tone: within normal limits Gait & Station: normal Patient leans: N/A  Psychiatric Specialty Exam: Physical Exam  ROS-no chest pain, no shortness of breath, no vomiting, no fever, no chills  Blood pressure (!) 115/101, pulse 72, temperature 98.3 F (36.8 C), temperature source Oral, resp. rate 20, height '5\' 4"'$  (1.626 m), weight 67.1 kg.Body mass index is 25.4 kg/m.  General Appearance: Fairly Groomed  Eye Contact: Improving eye contact  Speech: -Today selectively mute.  Does briefly answer some questions and as above communicates nonverbally with facial gestures  Volume:  Decreased  Mood: Depressed, does smile appropriately at times during session  Affect: Constricted although as noted smiles at times during session  Thought Process:  Linear and Descriptions of Associations: Difficult to establish due to paucity of speech  Orientation:  Other:  Fully alert and attentive  Thought Content: Does not currently appear internally preoccupied and does not express any delusions.  Suicidal Thoughts:  No-when asked about suicidal ideations he denies by shaking his head, but does not answer verbally  Homicidal Thoughts:  No -same as above  Memory:  Recent and remote fair  Judgement:  Fair  Insight:  Fair  Psychomotor Activity:  Decreased-no current psychomotor agitation or restlessness noted  Concentration:  Concentration: Fair and Attention Span: Fair  Recall:  [employer] of Knowledge:  Fair  Language: Poor  Akathisia:  Negative  Handed:  Right  AIMS (if indicated):     Assets:  Desire for Improvement Resilience  ADL's:  Intact  Cognition:  WNL  Sleep:  Number of Hours: 6.25   Assessment:  Patient is a 53 year old male, history of  methamphetamine dependence, history of substance-induced mood/psychotic  disorder.  Presented to the hospital reporting depression, suicidal thoughts, hallucinations (reports demeaning/insulting auditory hallucinations and fleeting visual hallucinations).  He reports he had been  sober x7 months until recent relapse.  He had been treated with Seroquel in the past but was not taking prior to admission.   Patient appears alert, calm, in no acute distress, without any psychomotor agitation or restlessness.  Initially during interaction with Probation officer and other staff he makes brief verbal remarks but as session progressed he became selectively mute.  He did demonstrate he was following conversation by answering some  questions nonverbally with facial gestures, denoting he understood what was being asked.  There is no current significant psychomotor rigidity, no psychomotor agitation, no EPS or stiffness are noted.  His gait is preserved and he is been noted to be ambulating the unit without difficulty.  He is not febrile.  Current presentation not suggestive of catatonia or NMS Blood pressure was initially elevated but has decreased noticeably compared to prior values.  I have reviewed antihypertensive treatment with hospitalist consultant who recommends continuing same management for now.  Treatment Plan Summary:  Treatment plan reviewed as below today 3/19 Daily contact with patient to assess and evaluate symptoms and progress in treatment, Medication management, Plan Inpatient treatment and Medications as below Encourage group and milieu participation to work on coping skills and symptom reduction Encourage efforts to work on sobriety and relapse prevention Increase Seroquel to 300 mg nightly for psychosis/mood disorder Start Ativan 1 mgr Q hours PRN for anxiety as needed  Continue Zoloft at 50 mg daily for depression Continue Norvasc, Coreg, Clonidine for management of hypertension Continue Lipitor for  management of hypercholesterolemia Treatment team working on disposition planning options Check BMP, CBC, CK .  Monitor Vitals Q 8 hours , including temperature  Jenne Campus, MD 3/18/2 020, 11:15 AM Patient ID: [name], male   DOB: 06-16-1965, 53 y.o.   MRN: [id number]

## 2018-07-05 NOTE — ED Notes (Signed)
Bed: YD74 Expected date:  Expected time:  Means of arrival:  Comments: Possible D/T

## 2018-07-06 LAB — RAPID URINE DRUG SCREEN, HOSP PERFORMED
Amphetamines: NOT DETECTED
Barbiturates: NOT DETECTED
Benzodiazepines: NOT DETECTED
Cocaine: NOT DETECTED
Opiates: NOT DETECTED
Tetrahydrocannabinol: NOT DETECTED

## 2018-07-06 LAB — URINALYSIS, ROUTINE W REFLEX MICROSCOPIC
Bilirubin Urine: NEGATIVE
Glucose, UA: NEGATIVE mg/dL
Hgb urine dipstick: NEGATIVE
Ketones, ur: NEGATIVE mg/dL
Leukocytes,Ua: NEGATIVE
Nitrite: NEGATIVE
Protein, ur: NEGATIVE mg/dL
Specific Gravity, Urine: 1.018 (ref 1.005–1.030)
pH: 5 (ref 5.0–8.0)

## 2018-07-06 MED ORDER — HYDRALAZINE HCL 25 MG PO TABS
25.0000 mg | ORAL_TABLET | Freq: Three times a day (TID) | ORAL | Status: DC
  Administered 2018-07-06 – 2018-07-08 (×6): 25 mg via ORAL
  Filled 2018-07-06 (×13): qty 1

## 2018-07-06 MED ORDER — LORAZEPAM 0.5 MG PO TABS
0.5000 mg | ORAL_TABLET | Freq: Three times a day (TID) | ORAL | Status: DC
  Administered 2018-07-06 (×2): 0.5 mg via ORAL
  Filled 2018-07-06 (×2): qty 1

## 2018-07-06 MED ORDER — HALOPERIDOL 2 MG PO TABS
2.0000 mg | ORAL_TABLET | Freq: Two times a day (BID) | ORAL | Status: DC
  Administered 2018-07-06 (×2): 2 mg via ORAL
  Filled 2018-07-06 (×6): qty 1

## 2018-07-06 MED ORDER — LACTULOSE 10 GM/15ML PO SOLN
10.0000 g | Freq: Three times a day (TID) | ORAL | Status: DC
  Administered 2018-07-06 – 2018-07-11 (×15): 10 g via ORAL
  Filled 2018-07-06 (×22): qty 15

## 2018-07-06 MED ORDER — LORAZEPAM 0.5 MG PO TABS
0.5000 mg | ORAL_TABLET | Freq: Two times a day (BID) | ORAL | Status: DC
  Administered 2018-07-07 – 2018-07-10 (×7): 0.5 mg via ORAL
  Filled 2018-07-06 (×7): qty 1

## 2018-07-06 NOTE — Progress Notes (Signed)
[facility] MD Progress Note  07/04/2018 11:15 AM Larri Yehle  MRN:  [id number] Subjective: Patient acknowledges depression, denies suicidal ideations.  Currently denies medication side effects.  Objective: I have discussed case with treatment team and have met with patient. Of note, I have also seen patient with Dr. Jake Samples and discussed presentation and medication management options with him and team.  Patient is a 53 year old male, history of methamphetamine dependence, history of substance-induced mood disorder.  Presented to the hospital reporting depression, suicidal thoughts, hallucinations (reports demeaning/insulting auditory hallucinations and fleeting visual hallucinations).  He reports he had been sober x7 months until recent relapse.  He had been treated with Seroquel in the past but was not taking prior to admission.   Similar to yesterday's presentation, patient is initially verbal but later during session becomes selectively mute, after which he continues to communicate nonverbally via gestures and facial expressions.  He was, however, more verbal today and better able to provide further information.  He appears alert, attentive.  Today he is oriented to Friday, March, 2020, and to [facility].  He corroborates recent relapse on methamphetamine and denies any alcohol or benzodiazepine or other drug use recently.  Today denies suicidal ideations, does not endorse hallucinations, but does appear cautious/guarded.  There is no current psychomotor agitation-no tremors or diaphoresis or restlessness are noted.  He has not presented with any disruptive or overtly agitated behaviors. No rigidity or EPS noted.  Of note, patient was sent to ED yesterday due to concern about confusion.  A head CT scan was done which was negative for hemorrhage or intracranial mass, demonstrated small foci of hypodensity within the white matter reflective of small vessel ischemic changes.  Labs reviewed -CK is 47, white blood  cells 6.6, differential within normal.  Of note, ammonia was elevated at 68.  Patient has a history of hypertension-BP remains elevated-today 149/116, in spite of management with  Amlodipine, Coreg, Clonidine. As above, he denies any recent alcohol or benzodiazepine abuse, admission BAL was negative, admission UDS was negative for benzos.. He does not appear tremulous, diaphoretic, agitated.  His pulse is 92.  Therefore, current presentation not suggestive of acute benzo or alcohol withdrawal.  I have reviewed case with Dr. Auburn Bilberry , hospitalist, who has been following patient for management of hypertension.  We also reviewed elevated ammonia-Dr. Ainsley Spinner will make antihypertensive medication adjustments and recommended continuing lactulose for now.     Principal Problem: Substance (stimulant) dependence, substance-induced mood disorder, substance-induced psychosis. 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 Cancer ([place])   . Hypertension   . Renal disorder     Past Surgical History:  Procedure Laterality Date  . CARDIAC SURGERY    . CYSTOSCOPY    . LITHOTRIPSY     Family History: History reviewed. No pertinent family history. Family Psychiatric  History:  Social History:  Social History   Substance and Sexual Activity  Alcohol Use Not Currently     Social History   Substance and Sexual Activity  Drug Use Yes    Social History   Socioeconomic History  . Marital status: Single    Spouse name: Not on file  . Number of children: Not on  file  . Years of education: Not on file  . Highest education level: Not on file  Occupational History   . Not on file  Social Needs  . Financial resource strain: Not on file  . Food insecurity:    Worry: Not on file    Inability: Not on file  . Transportation needs:    Medical: Not on file    Non-medical: Not on file  Tobacco Use  . Smoking status: Current Every Day Smoker    Types: E-cigarettes  . Smokeless tobacco: Former Systems developer    Types: Singer date: 05/12/2008  Substance and Sexual Activity  . Alcohol use: Not Currently  . Drug use: Yes  . Sexual activity: Not Currently  Lifestyle  . Physical activity:    Days per week: Not on file    Minutes per session: Not on file  . Stress: Not on file  Relationships  . Social connections:    Talks on phone: Not on file    Gets together: Not on file    Attends religious service: Not on file    Active member of club or organization: Not on file    Attends meetings of clubs or organizations: Not on file    Relationship status: Not on file  Other Topics Concern  . Not on file  Social History Narrative  . Not on file   Additional Social History:    Sleep: Fair  Appetite:  Fair, but has been eating with encouragement from staff  Current Medications: Current Facility-Administered Medications  Medication Dose Route Frequency Provider Last Rate Last Dose  . acetaminophen (TYLENOL) tablet 650 mg  650 mg Oral Q6H PRN Patrecia Pour, NP      . albuterol (PROVENTIL HFA;VENTOLIN HFA) 108 (90 Base) MCG/ACT inhaler 2 puff  2 puff Inhalation Q6H PRN Patrecia Pour, NP      . alum & mag hydroxide-simeth (MAALOX/MYLANTA) 200-200-20 MG/5ML suspension 30 mL  30 mL Oral Q4H PRN Patrecia Pour, NP      . amLODipine (NORVASC) tablet 10 mg  10 mg Oral Daily Patrecia Pour, NP   10 mg at 07/04/18 0825  . atorvastatin (LIPITOR) tablet 40 mg  40 mg Oral QHS Patrecia Pour, NP   40 mg at 07/03/18 2112  . carvedilol (COREG) tablet 25 mg  25 mg Oral Daily Patrecia Pour, NP   25 mg at 07/04/18 0825  . cloNIDine (CATAPRES) tablet 0.3 mg  0.3 mg  Oral TID Patrecia Pour, NP   0.3 mg at 07/04/18 0825  . lisinopril (PRINIVIL,ZESTRIL) tablet 20 mg  20 mg Oral Daily Patrecia Pour, NP   20 mg at 07/04/18 0825  . magnesium hydroxide (MILK OF MAGNESIA) suspension 30 mL  30 mL Oral Daily PRN Patrecia Pour, NP      . neomycin-bacitracin-polymyxin (NEOSPORIN) ointment   Topical TID Sharma Covert, MD      . nicotine (NICODERM CQ - dosed in mg/24 hours) patch 21 mg  21 mg Transdermal Daily Sharma Covert, MD   21 mg at 07/04/18 0826  . QUEtiapine (SEROQUEL) tablet 200 mg  200 mg Oral QHS Sharma Covert, MD   200 mg at 07/03/18 2112  . sertraline (ZOLOFT) tablet 25 mg  25 mg Oral Daily Sharma Covert, MD   25 mg at 07/04/18 0825  . tamsulosin (FLOMAX) capsule 0.4 mg  0.4 mg Oral Daily Patrecia Pour, NP  0.4 mg at 07/04/18 0825    Lab Results:  Results for orders placed or performed during the hospital encounter of 07/02/18 (from the past 48 hour(s))  Basic metabolic panel     Status: Abnormal   Collection Time: 07/04/18  6:09 AM  Result Value Ref Range   Sodium 140 135 - 145 mmol/L   Potassium 3.9 3.5 - 5.1 mmol/L   Chloride 111 98 - 111 mmol/L   CO2 21 (L) 22 - 32 mmol/L   Glucose, Bld 95 70 - 99 mg/dL   BUN 29 (H) 6 - 20 mg/dL   Creatinine, Ser 1.33 (H) 0.61 - 1.24 mg/dL   Calcium 9.0 8.9 - 10.3 mg/dL   GFR calc non Af Amer >60 >60 mL/min   GFR calc Af Amer >60 >60 mL/min   Anion gap 8 5 - 15    Comment: Performed at [facility], [place] [address], [place], [place] [address]  Brain natriuretic peptide     Status: None   Collection Time: 07/04/18  6:09 AM  Result Value Ref Range   B Natriuretic Peptide 37.3 0.0 - 100.0 pg/mL    Comment: Performed at [facility], [place] [address], [place], [place] [address]    Blood Alcohol level:  Lab Results  Component Value Date   ETH <10 03/54/6568    Metabolic Disorder Labs: No results found for: HGBA1C, MPG No results found for:  PROLACTIN No results found for: CHOL, TRIG, HDL, CHOLHDL, VLDL, LDLCALC  Physical Findings: AIMS: Facial and Oral Movements Muscles of Facial Expression: None, normal Lips and Perioral Area: None, normal Jaw: None, normal Tongue: None, normal,Extremity Movements Upper (arms, wrists, hands, fingers): None, normal Lower (legs, knees, ankles, toes): None, normal, Trunk Movements Neck, shoulders, hips: None, normal, Overall Severity Severity of abnormal movements (highest score from questions above): None, normal Incapacitation due to abnormal movements: None, normal Patient's awareness of abnormal movements (rate only patient's report): No Awareness, Dental Status Current problems with teeth and/or dentures?: No Does patient usually wear dentures?: No  CIWA:    COWS:     Musculoskeletal: Strength & Muscle Tone: within normal limits Gait & Station: normal Patient leans: N/A  Psychiatric Specialty Exam: Physical Exam  ROS-currently denies headache, denies chest pain, denies shortness of breath  Blood pressure (!) 115/101, pulse 72, temperature 98.3 F (36.8 C), temperature source Oral, resp. rate 20, height '5\' 4"'$  (1.626 m), weight 67.1 kg.Body mass index is 25.4 kg/m.  General Appearance: Fairly Groomed  Eye Contact: Intermittent eye contact  Speech: -Initially verbal but as session progresses becomes selectively mute  Volume:  Decreased  Mood: Depressed, smiles briefly at times  Affect: Remains blunted  Thought Process: Peers linear  Orientation: Currently presents alert and as above is oriented to [facility], Friday, March,2020  Thought Content: Does not currently appear internally preoccupied and does not express any delusions.  Suicidal Thoughts: Denies suicidal or homicidal ideations  Homicidal Thoughts:  No   Memory:  Recent and remote fair  Judgement:  Fair  Insight:  Fair  Psychomotor Activity:  Decreased-not agitated, restless, or disruptive  Concentration:  Concentration:  Fair and Attention Span: Fair  Recall:  [employer] of Knowledge:  Fair  Language: Poor  Akathisia:  Negative  Handed:  Right  AIMS (if indicated):     Assets:  Desire for Improvement Resilience  ADL's:  Intact  Cognition:  WNL  Sleep:  Number of Hours: 6.25   Assessment:  Patient is a 53 year old male, history of methamphetamine dependence, history of substance-induced mood/psychotic  disorder.  Presented to the hospital reporting depression, suicidal thoughts, hallucinations (reports demeaning/insulting auditory hallucinations and fleeting visual hallucinations).  He reports he had been sober x7 months until recent relapse.  He had been treated with Seroquel in the past but was not taking prior to admission.   Patient seen with colleague Dr. Jake Samples, discussed with team, discussed with hospitalist consultant.  Patient has presented with intermittent selective mutism.  Today he presents somewhat more verbal than he did yesterday but overall presentation is similar in that he initially responds verbally but as session progresses he becomes selectively mute, sometimes staring blankly, but able to be redirected to conversation.  He endorses depression but denies suicidal ideations and is future oriented, did state that he wanted to return to [place] after discharge.  Selective mutism is not currently associated with muscular rigidity or other symptoms of catatonia-patient has been up from bed and at times walking in his room and in the hall.  CK is not elevated, WBC is not elevated.  He is blood pressure remains elevated in spite of management with 3 antihypertensives-hospitalist assisting with antihypertensive adjustments.  His ammonia is elevated-he currently does not endorse any history of liver disease.  Currently on lactulose.  As reviewed with team and Dr. Jake Samples will switch from Seroquel to Haldol . Will also continue brief trial with Ativan in order to address anxiety. Continue Zoloft  . Treatment Plan Summary:  Treatment plan reviewed as below today 3/20  Daily contact with patient to assess and evaluate symptoms and progress in treatment, Medication management, Plan Inpatient treatment and Medications as below Encourage group and milieu participation to work on coping skills and symptom reduction Encourage efforts to work on sobriety and relapse prevention D/C Seroquel Start Haldol 2 mgrs BID for psychosis ( 3/19 QTc 409)  Start Ativan 0.5 mgrs TID for anxiety, hold if sedated . Continue Zoloft at 50 mg daily for depression Continue Norvasc, Coreg, Clonidine for management of hypertension.Hospitalist following for antihypertensive adjustment . Continue  Lactulose for hyperammonemia. Continue Lipitor for management of hypercholesterolemia Treatment team working on disposition planning options Monitor Vitals Q 8 hours , including temperature  Jenne Campus, MD 3/18/2 020, 11:15 AM Patient ID: [name], male   DOB: 1965/05/21, 53 y.o.   MRN: [id number]

## 2018-07-06 NOTE — Plan of Care (Signed)
  Problem: Education: Goal: Mental status will improve Outcome: Progressing   Problem: Activity: Goal: Interest or engagement in activities will improve Outcome: Not Progressing  Dar Note: Patient is alert and oriented to person.  Patient continues to respond with nonverbal gestures.  Continues with bizarre behavior such as sitting in the bathroom with light off for few hours.  Patient redirected back to his room.  Medication given as prescribed.  Food and fluid intake encouraged.  Patient is safe on the unit.

## 2018-07-06 NOTE — ED Notes (Signed)
Handoff report given to Nira Conn, RN at [facility], pt is able to return to 304-2 on the adult unit. [place] to be notified.

## 2018-07-06 NOTE — Progress Notes (Addendum)
No charge note: Events yesterday evening noted. Spoke with psychiatry team, Dr. Parke Poisson, today. BP remains elevated with no hypotension. Pt is selectively mute and exhibiting some bizarre behaviors but is not lethargic. Ammonia mildly elevated, lactulose given in ED. Note creatinine is at baseline, so no need to decrease/stop ACE. - Will start hydralazine 25mg  po TID - Can continue lactulose, hold for diarrhea, though significance of hyperammonemia is uncertain. - I will continue to follow this patient remotely.   Vance Gather, MD Provider on-call pager: [phone] 07/06/2018 10:06 AM

## 2018-07-06 NOTE — Progress Notes (Signed)
Recreation Therapy Notes  Date:  3.20.20 Time: 0930 Location: 300 Hall Dayroom  Group Topic: Stress Management  Goal Area(s) Addresses:  Patient will identify positive stress management techniques. Patient will identify benefits of using stress management post d/c.  Intervention:  Stress Management   Activity :  Meditation.  LRT introduced patients to the stress management technique of meditation.  LRT played a meditation that focused on making the most of your day and the possibilities that can be had.  Patients were to follow along as meditation played to engage in activity.   Education:  Stress Management, Discharge Planning.   Education Outcome: Acknowledges Education  Clinical Observations/Feedback: Pt did not attend group.      Victorino Sparrow, LRT/CTRS         Ria Comment, Miri Jose A 07/06/2018 11:00 AM

## 2018-07-06 NOTE — Progress Notes (Signed)
Writer has just medicated patient and vitals had been taken before patient returned to what he thought was his room. MHT on the hall notified M. Tommi Rumps RN and she relayed information on to Probation officer after finishing up with patient at 300 med window. It was reported that patient entered a females room and was observed by MHT sitting on the opposite bed. Patient was frightened and reported that he told her to get out of his room and take her things with her. AC notified and patient was seen by provider to see if patient was disoriented. Safety maintained on unit with 15 min checks

## 2018-07-06 NOTE — Progress Notes (Signed)
Patient back on the unit after medical clearance by [facility]. Kalab walked back to his room, he would not stop to answer questions asked by this RN, he just nodded yes or no while continuing to walk. Patient still electively mute and is endorsing auditory hallucinations, denies SI. Elevated ammonia levels noted from lab work done at the ED this evening. Will discuss with provider and report off to next nurse.

## 2018-07-07 LAB — BASIC METABOLIC PANEL
Anion gap: 8 (ref 5–15)
BUN: 46 mg/dL — ABNORMAL HIGH (ref 6–20)
CO2: 20 mmol/L — ABNORMAL LOW (ref 22–32)
Calcium: 9.5 mg/dL (ref 8.9–10.3)
Chloride: 111 mmol/L (ref 98–111)
Creatinine, Ser: 1.97 mg/dL — ABNORMAL HIGH (ref 0.61–1.24)
GFR calc Af Amer: 44 mL/min — ABNORMAL LOW (ref >60)
GFR, EST NON AFRICAN AMERICAN: 38 mL/min — AB (ref >60)
Glucose, Bld: 143 mg/dL — ABNORMAL HIGH (ref 70–99)
Potassium: 4.5 mmol/L (ref 3.5–5.1)
Sodium: 139 mmol/L (ref 135–145)

## 2018-07-07 LAB — AMMONIA: AMMONIA: 88 umol/L — AB (ref 9–35)

## 2018-07-07 MED ORDER — CLONIDINE HCL 0.2 MG PO TABS
0.2000 mg | ORAL_TABLET | Freq: Three times a day (TID) | ORAL | Status: DC
  Administered 2018-07-07 – 2018-07-10 (×7): 0.2 mg via ORAL
  Filled 2018-07-07 (×4): qty 1
  Filled 2018-07-07: qty 2
  Filled 2018-07-07 (×7): qty 1

## 2018-07-07 MED ORDER — HYDROXYZINE HCL 50 MG PO TABS
ORAL_TABLET | ORAL | Status: AC
  Filled 2018-07-07: qty 1

## 2018-07-07 MED ORDER — HYDROXYZINE HCL 50 MG PO TABS
50.0000 mg | ORAL_TABLET | Freq: Once | ORAL | Status: AC
  Administered 2018-07-07: 50 mg via ORAL

## 2018-07-07 MED ORDER — HALOPERIDOL 2 MG PO TABS
2.0000 mg | ORAL_TABLET | Freq: Three times a day (TID) | ORAL | Status: DC
  Administered 2018-07-07 – 2018-07-08 (×4): 2 mg via ORAL
  Filled 2018-07-07 (×9): qty 1

## 2018-07-07 NOTE — Progress Notes (Signed)
Dayroom was closed at 2300. Patient continued to sit in hallway chair by phone. Patient coughing, sneezing on staff computer and began to laugh. Does not appear to understand why behavior is inappropriate. Mask provided to patient and education given on how and why to use. Patient was medicated with vistaril 50mg  to promote sleep. This Probation officer escorted patient to room once he was observed to be sleepy. Once in room, patient stepped towards this writer attempting to kiss and grope her. Patient was stopped at which point he seemed hurt as if not understanding why this behavior is inappropriate. Patient's primary RN, Kennyth Lose updated along with Arville Go, [facility]. Order received for no roommate as patient has poor judgement and very poor boundaries.

## 2018-07-07 NOTE — Plan of Care (Signed)
  Problem: Education: Goal: Emotional status will improve Outcome: Not Progressing Goal: Mental status will improve Outcome: Not Progressing   

## 2018-07-07 NOTE — Progress Notes (Signed)
Pt did not attend wrap-up group   

## 2018-07-07 NOTE — Progress Notes (Signed)
Writer spoke with patient 1;1 and he reports having had a good day. He started out answering writers questions and then started nodding his head to answer or just smile. Writer informed him of medications ordered and he was compliant. He was observed up in the dayroom more tonight than the previous night. He currently has no goal that he is working towards. Safety maintained on unit with 15 min checks.

## 2018-07-07 NOTE — Progress Notes (Signed)
Adult Psychoeducational Group Note  Date:  07/07/2018 Time:  9:55 PM  Group Topic/Focus:  Wrap-Up Group:   The focus of this group is to help patients review their daily goal of treatment and discuss progress on daily workbooks.  Participation Level:  Minimal  Participation Quality:  Inattentive  Affect:  Flat  Cognitive:  Appropriate  Insight: Good  Engagement in Group:  Limited    Additional Comments: Patient didn't attend group but did share with writer he had a good day.    Quentin Angst 07/07/2018, 9:55 PM

## 2018-07-07 NOTE — Progress Notes (Addendum)
[facility] MD Progress Note  07/04/2018 11:15 AM Robert Shepherd  MRN:  [id number] Subjective: Patient states he feels "okay".  Describes mild frontal headache.  Denies suicidal ideations.  Currently denies medication side effects.  Objective: I have reviewed chart notes and have met with patient.  Patient is a 53 year old male, history of methamphetamine dependence, history of substance-induced mood disorder.  Presented to the hospital reporting depression, suicidal thoughts, hallucinations (reports demeaning/insulting auditory hallucinations and fleeting visual hallucinations).  He reports he had been sober x7 months until recent relapse.  He had been treated with Seroquel in the past but was not taking prior to admission.   Patient presents with some improvement today.  Currently he does not appear to be in any acute distress and is comfortable in bed.  He is awake, attentive.  Today more verbal than yesterday although still responds to most questions with either monosyllables or short phrases.  He denies suicidal ideations.  He describes auditory hallucinations saying "bad things", although denies command hallucinations.  He presents more future oriented and today states that he wants to relocate to [place], [facility] after discharge, as he has family there.  Noted to exhibit a fuller range of affect today, smiles at times appropriately during session, seems less blunted, and even exhibited some humor today, stated that down above" only has one street light" and laughed briefly.  Patient has had some confusion, possible sundowning, and yesterday night entered another patient's room by mistake-he has been easily redirectable, and not overtly agitated or restless.  At this time, however, patient is alert, attentive, and he is oriented to "mental hospital in [place] [facility] [place]", and to "March, 2020", although does not know specific date.  Recall is 3 out of 3 immediate and 2 out of 3 at about 3 minutes. Currently does not  endorse medication side effects. Blood pressure has improved and today is 108/89. Repeat ammonia remains elevated at 88.     Principal Problem: Substance (stimulant) dependence, substance-induced mood disorder, substance-induced psychosis. 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 Cancer ([place])   . Hypertension   . Renal disorder     Past Surgical History:  Procedure Laterality Date  . CARDIAC SURGERY    . CYSTOSCOPY    . LITHOTRIPSY     Family History: History reviewed. No pertinent family history. Family Psychiatric  History:  Social History:  Social History   Substance and Sexual Activity  Alcohol Use Not Currently     Social History   Substance and Sexual Activity  Drug Use Yes    Social History   Socioeconomic History  . Marital status: Single    Spouse name: Not on file  . Number of children: Not on file  . Years of education: Not on file  . Highest education level: Not on file  Occupational History  . Not on file  Social Needs  . Financial resource strain: Not on file  . Food insecurity:    Worry: Not on file    Inability: Not on file  . Transportation needs:    Medical: Not on file    Non-medical: Not on file  Tobacco Use  . Smoking status: Current Every Day Smoker  Types: E-cigarettes  . Smokeless tobacco: Former Systems developer    Types: [place] date: 05/12/2008  Substance and Sexual Activity  . Alcohol use: Not Currently  . Drug use: Yes  . Sexual activity: Not Currently  Lifestyle  . Physical activity:    Days per week: Not on file    Minutes per session: Not on file  . Stress: Not on file   Relationships  . Social connections:    Talks on phone: Not on file    Gets together: Not on file    Attends religious service: Not on file    Active member of club or organization: Not on file    Attends meetings of clubs or organizations: Not on file    Relationship status: Not on file  Other Topics Concern  . Not on file  Social History Narrative  . Not on file   Additional Social History:    Sleep: Fair/improving  Appetite: Fair/improving  Current Medications: Current Facility-Administered Medications  Medication Dose Route Frequency Provider Last Rate Last Dose  . acetaminophen (TYLENOL) tablet 650 mg  650 mg Oral Q6H PRN Patrecia Pour, NP      . albuterol (PROVENTIL HFA;VENTOLIN HFA) 108 (90 Base) MCG/ACT inhaler 2 puff  2 puff Inhalation Q6H PRN Patrecia Pour, NP      . alum & mag hydroxide-simeth (MAALOX/MYLANTA) 200-200-20 MG/5ML suspension 30 mL  30 mL Oral Q4H PRN Patrecia Pour, NP      . amLODipine (NORVASC) tablet 10 mg  10 mg Oral Daily Patrecia Pour, NP   10 mg at 07/04/18 0825  . atorvastatin (LIPITOR) tablet 40 mg  40 mg Oral QHS Patrecia Pour, NP   40 mg at 07/03/18 2112  . carvedilol (COREG) tablet 25 mg  25 mg Oral Daily Patrecia Pour, NP   25 mg at 07/04/18 0825  . cloNIDine (CATAPRES) tablet 0.3 mg  0.3 mg Oral TID Patrecia Pour, NP   0.3 mg at 07/04/18 0825  . lisinopril (PRINIVIL,ZESTRIL) tablet 20 mg  20 mg Oral Daily Patrecia Pour, NP   20 mg at 07/04/18 0825  . magnesium hydroxide (MILK OF MAGNESIA) suspension 30 mL  30 mL Oral Daily PRN Patrecia Pour, NP      . neomycin-bacitracin-polymyxin (NEOSPORIN) ointment   Topical TID Sharma Covert, MD      . nicotine (NICODERM CQ - dosed in mg/24 hours) patch 21 mg  21 mg Transdermal Daily Sharma Covert, MD   21 mg at 07/04/18 0826  . QUEtiapine (SEROQUEL) tablet 200 mg  200 mg Oral QHS Sharma Covert, MD   200 mg at 07/03/18 2112  . sertraline (ZOLOFT) tablet 25 mg  25 mg Oral  Daily Sharma Covert, MD   25 mg at 07/04/18 0825  . tamsulosin (FLOMAX) capsule 0.4 mg  0.4 mg Oral Daily Patrecia Pour, NP   0.4 mg at 07/04/18 0825    Lab Results:  Results for orders placed or performed during the hospital encounter of 07/02/18 (from the past 48 hour(s))  Basic metabolic panel     Status: Abnormal   Collection Time: 07/04/18  6:09 AM  Result Value Ref Range   Sodium 140 135 - 145 mmol/L   Potassium 3.9 3.5 - 5.1 mmol/L   Chloride 111 98 - 111 mmol/L   CO2 21 (L) 22 - 32 mmol/L   Glucose, Bld 95 70 - 99 mg/dL  BUN 29 (H) 6 - 20 mg/dL   Creatinine, Ser 1.33 (H) 0.61 - 1.24 mg/dL   Calcium 9.0 8.9 - 10.3 mg/dL   GFR calc non Af Amer >60 >60 mL/min   GFR calc Af Amer >60 >60 mL/min   Anion gap 8 5 - 15    Comment: Performed at [facility], [place] [address] [place], [place] [address]  Brain natriuretic peptide     Status: None   Collection Time: 07/04/18  6:09 AM  Result Value Ref Range   B Natriuretic Peptide 37.3 0.0 - 100.0 pg/mL    Comment: Performed at [facility], [place] [address], [place],  [address]    Blood Alcohol level:  Lab Results  Component Value Date   ETH <10 01/74/9449    Metabolic Disorder Labs: No results found for: HGBA1C, MPG No results found for: PROLACTIN No results found for: CHOL, TRIG, HDL, CHOLHDL, VLDL, LDLCALC  Physical Findings: AIMS: Facial and Oral Movements Muscles of Facial Expression: None, normal Lips and Perioral Area: None, normal Jaw: None, normal Tongue: None, normal,Extremity Movements Upper (arms, wrists, hands, fingers): None, normal Lower (legs, knees, ankles, toes): None, normal, Trunk Movements Neck, shoulders, hips: None, normal, Overall Severity Severity of abnormal movements (highest score from questions above): None, normal Incapacitation due to abnormal movements: None, normal Patient's awareness of abnormal movements (rate only patient's report): No  Awareness, Dental Status Current problems with teeth and/or dentures?: No Does patient usually wear dentures?: No  CIWA:    COWS:     Musculoskeletal: Strength & Muscle Tone: within normal limits Gait & Station: normal Patient leans: N/A  Psychiatric Specialty Exam: Physical Exam  ROS-reports frontal headache.  Describes as mild.  Denies  chest pain, denies shortness of breath, no fever or chills  Blood pressure (!) 115/101, pulse 72, temperature 98.3 F (36.8 C), temperature source Oral, resp. rate 20, height _0  (1.626 m), weight 67.1 kg.Body mass index is 25.4 kg/m.  General Appearance: Fairly Groomed  Eye Contact: Better eye contact today  Speech: -Speech remains limited but is not selectively mute today and does communicate verbally as above  Volume:  Decreased  Mood: Acknowledges feeling better  Affect: Less blunted today, smiles briefly/appropriately  Thought Process: Overall presents linear, although slowed  Orientation: Currently presents alert and as above is oriented to [facility], , [place]  Thought Content: Today acknowledges having auditory hallucinations.  Does not currently appear internally preoccupied.  No delusions are expressed.  Suicidal Thoughts: Denies suicidal or homicidal ideations-contracts for safety on unit  Homicidal Thoughts:  No   Memory: 3/3 immediate.  2/3 at 2 to 3 minutes  Judgement:  Fair  Insight:  Fair  Psychomotor Activity:  Decreased-no current psychomotor agitation noted or described by staff  Concentration:  Concentration: Fair but partially better than yesterday and Attention Span: Fair  Recall:  [employer] of Knowledge:  Fair  Language: Poor  Akathisia:  Negative  Handed:  Right  AIMS (if indicated):     Assets:  Desire for Improvement Resilience  ADL's:  Intact  Cognition:  WNL  Sleep:  Number of Hours: 6.25   Assessment:  Patient is a 53 year old male, history of methamphetamine dependence, history of substance-induced  mood/psychotic  disorder.  Presented to the hospital reporting depression, suicidal thoughts, hallucinations (reports demeaning/insulting auditory hallucinations and fleeting visual hallucinations).  He reports he had been sober x7 months until recent relapse.  He had been treated with Seroquel  in the past but was not taking prior to admission.   Patient is presenting with some improvement compared to recent presentation.  As noted above, today he is more verbal and answers most questions, although with short phrases or monosyllables.  He also presents with a less blunted and more reactive affect.  Today he is acknowledging having persistent auditory hallucinations, which she describes as" bad" but does not elaborate on further.  He does deny any command hallucinations and contracts for safety on unit/denies suicidal or self-injurious ideation/denies homicidal ideations.  Ammonia remains elevated, and patient may be experiencing some nighttime confusion/sundowning as was noted to be going into wrong rooms yesterday evening.  Currently denies medication side effects.  Blood pressure significantly improved.  I have reviewed with Dr. Bonner Puna- hospitalist and we have reviewed current treatment/medications/antihypertensive regimen. Recommendation  is to continue current management at this time, as BP improving and patient tolerating well thus far .  Treatment Plan Summary:  Treatment plan reviewed as below today 3/21 Daily contact with patient to assess and evaluate symptoms and progress in treatment, Medication management, Plan Inpatient treatment and Medications as below Encourage group and milieu participation to work on coping skills and symptom reduction Encourage efforts to work on sobriety and relapse prevention Increase Haldol to 2 mgrs TID for psychosis Continue  Ativan 0.5 mgrs BID for anxiety, hold if sedated . Continue Zoloft at 50 mg daily for depression Continue Norvasc, Coreg, Clonidine for  management of hypertension.Hospitalist following for antihypertensive adjustment . Continue  Lactulose for hyperammonemia. Continue Lipitor for management of hypercholesterolemia Treatment team working on disposition planning options Monitor Vitals Q 8 hours , including temperature  Jenne Campus, MD 3/18/2 020, 11:15 AM Patient ID: Robert Shepherd, male   DOB: May 05, 1965, 53 y.o.   MRN: [id number]  Addendum 07/07/2018 at 3,45 PM. Reviewed with Dr. Gwenyth Bender has trended down.  Recommendation is to taper clonidine from 0.3 mg 3 times daily down to 0.2 mg 3 times daily.  We will continue to monitor.

## 2018-07-07 NOTE — Progress Notes (Signed)
D Robert Shepherd is observed lying in his bed on his left side. He has horrendous body odor. He will not make eye contact with this Probation officer. He shakes his head "yes" and " no" to this writer's questions. HE refuses to eat.     A HE refused to answer the quesitons on his daily assessment. He will verbally contract with this writer to not hurt himself and he denies having suicidal ideaiton today. He took his am meds around 1215 ( he would not wake up earlier today to do this) and he is advised by this nurse to force fluids due to him refusing to eat as well as his po fluid intake.      R Safety in place. Writer discussed BP meds as well as Robert Shepherd's overall status with Dr. Parke Poisson. Will continue to force fluids, check BP and prevent fals AMAP.

## 2018-07-08 MED ORDER — HALOPERIDOL 5 MG PO TABS
5.0000 mg | ORAL_TABLET | Freq: Once | ORAL | Status: AC
  Administered 2018-07-08: 5 mg via ORAL
  Filled 2018-07-08 (×2): qty 1

## 2018-07-08 MED ORDER — HALOPERIDOL LACTATE 5 MG/ML IJ SOLN
5.0000 mg | Freq: Every day | INTRAMUSCULAR | Status: DC
  Filled 2018-07-08: qty 1

## 2018-07-08 MED ORDER — HYDRALAZINE HCL 25 MG PO TABS: 25.0000 mg | ORAL_TABLET | Freq: Three times a day (TID) | ORAL | Status: DC | PRN

## 2018-07-08 MED ORDER — HALOPERIDOL 5 MG PO TABS
5.0000 mg | ORAL_TABLET | Freq: Every day | ORAL | Status: DC
  Administered 2018-07-08 – 2018-07-09 (×2): 5 mg via ORAL
  Filled 2018-07-08 (×3): qty 1

## 2018-07-08 MED ORDER — HALOPERIDOL 2 MG PO TABS
2.0000 mg | ORAL_TABLET | ORAL | Status: DC
  Administered 2018-07-09 – 2018-07-11 (×3): 2 mg via ORAL
  Filled 2018-07-08 (×2): qty 1
  Filled 2018-07-08: qty 14
  Filled 2018-07-08 (×2): qty 1
  Filled 2018-07-08: qty 14
  Filled 2018-07-08: qty 1

## 2018-07-08 MED ORDER — HALOPERIDOL 5 MG PO TABS
5.0000 mg | ORAL_TABLET | Freq: Every day | ORAL | Status: DC
  Filled 2018-07-08: qty 1

## 2018-07-08 NOTE — Plan of Care (Signed)
  Problem: Activity: Goal: Interest or engagement in activities will improve Outcome: Not Progressing   Problem: Safety: Goal: Periods of time without injury will increase Outcome: Progressing  Dar Note: Patient presents with anxious affect and depressed mood.  Denies suicidal thoughts, auditory and visual hallucinations.  Patient is alert and oriented to person and place.  Medication given as prescribed.  Remained in bed for most of this shift.  Food and fluid intake encouraged.  Low BP result of 82/71 reported to MD.  Gatorade given and patient encouraged to sit up and have his meal this afternoon.  Offered support as needed.  Routine safety checks maintained every 15 minutes.  Patient is safe on the unit.

## 2018-07-08 NOTE — [facility] Counselor (Signed)
CSW spoke with patient's mother about her last interactions with son.She visited with him approximately 2 weeks ago. She reported that he appeared distant and preoccupied with his cell phone. Per the mother report the patient accused her " telling people he was messing with children". She denied doing this and stated that she never had reason to suspect this behavior and had never made any statement like that. She described the patient being paranoid and believing he was being followed by drones.

## 2018-07-08 NOTE — Progress Notes (Addendum)
Robert Shepherd  07/04/2018 11:15 AM Robert Shepherd  MRN:  [id number] Subjective: Patient reports feeling "okay".  Denies suicidal ideations.  Does not endorse medication side effects.  Objective: I have reviewed chart notes and have met with patient. Patient is a 53 year old male, history of methamphetamine dependence, history of substance-induced mood disorder and psychosis.  Presented to the hospital reporting depression, suicidal thoughts, hallucinations (reports demeaning/insulting auditory hallucinations and fleeting visual hallucinations).  He reports he had been sober x7 months until recent relapse.  He had been treated with Seroquel in the past but was not taking prior to admission.   Today patient presents calm, in no acute distress, smiles on approach, is verbal but speaks with short phrases or monosyllables.  Presents awake, alert, and attentive.  Partially oriented: " Monday,March, 2020" -does not know exact date or day of week- "[facility]  in [place]" .  He states he is "okay".   Today denies hallucinations, and at this time does not appear internally preoccupied.  No overt delusions are expressed.  (Had endorsed auditory hallucinations saying "bad things" yesterday). Staff notes indicate he has been less isolative, spending some more time in dayroom yesterday.  He has been eating meals and drinking fluids  in his room, although needing some nursing encouragement to do this.  His blood pressure has trended down and was 124/91 earlier today.  As per chart, patient was noted to be coughing yesterday evening.  No coughing noted this morning.  No shortness of breath.  Breathing comfortably at room air-respirations 18/min.  No fever, temp today 97.7.  Does not endorse malaise, myalgias or other systemic symptoms at this time.  We have reviewed importance of coughing etiquette.  Labs reviewed-creatinine has increased to 1.97.    Principal Problem: Substance (stimulant) dependence,  substance-induced mood disorder, substance-induced psychosis. 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 Cancer ([place])   . Hypertension   . Renal disorder     Past Surgical History:  Procedure Laterality Date  . CARDIAC SURGERY    . CYSTOSCOPY    . LITHOTRIPSY     Family History: History reviewed. No pertinent family history. Family Psychiatric  History:  Social History:  Social History   Substance and Sexual Activity  Alcohol Use Not Currently     Social History   Substance and Sexual Activity  Drug Use Yes    Social History   Socioeconomic History  . Marital status: Single    Spouse name: Not on file  . Number of children: Not on file  . Years of education: Not on file  . Highest education level: Not on file  Occupational History  . Not on file  Social Needs  . Financial resource strain: Not on file  . Food insecurity:    Worry: Not on file    Inability: Not on file  . Transportation needs:    Medical: Not on file    Non-medical: Not on file  Tobacco Use  . Smoking status: Current Every Day Smoker    Types: E-cigarettes  . Smokeless tobacco: Former Systems developer    Types: [place] date: 05/12/2008  Substance and Sexual Activity  .  Alcohol use: Not Currently  . Drug use: Yes  . Sexual activity: Not Currently  Lifestyle  . Physical activity:    Days per week: Not on file    Minutes per session: Not on file  . Stress: Not on file  Relationships  . Social connections:    Talks on phone: Not on file    Gets together: Not on file    Attends religious service: Not on file    Active member of club or organization: Not  on file    Attends meetings of clubs or organizations: Not on file    Relationship status: Not on file  Other Topics Concern  . Not on file  Social History Narrative  . Not on file   Additional Social History:    Sleep: Improving  Appetite: Fair  Current Medications: Current Facility-Administered Medications  Medication Dose Route Frequency Provider Last Rate Last Dose  . acetaminophen (TYLENOL) tablet 650 mg  650 mg Oral Q6H PRN Robert Pour, NP      . albuterol (PROVENTIL HFA;VENTOLIN HFA) 108 (90 Base) MCG/ACT inhaler 2 puff  2 puff Inhalation Q6H PRN Robert Pour, NP      . alum & mag hydroxide-simeth (MAALOX/MYLANTA) 200-200-20 MG/5ML suspension 30 mL  30 mL Oral Q4H PRN Robert Pour, NP      . amLODipine (NORVASC) tablet 10 mg  10 mg Oral Daily Robert Pour, NP   10 mg at 07/04/18 0825  . atorvastatin (LIPITOR) tablet 40 mg  40 mg Oral QHS Robert Pour, NP   40 mg at 07/03/18 2112  . carvedilol (COREG) tablet 25 mg  25 mg Oral Daily Robert Pour, NP   25 mg at 07/04/18 0825  . cloNIDine (CATAPRES) tablet 0.3 mg  0.3 mg Oral TID Robert Pour, NP   0.3 mg at 07/04/18 0825  . lisinopril (PRINIVIL,ZESTRIL) tablet 20 mg  20 mg Oral Daily Robert Pour, NP   20 mg at 07/04/18 0825  . magnesium hydroxide (MILK OF MAGNESIA) suspension 30 mL  30 mL Oral Daily PRN Robert Pour, NP      . neomycin-bacitracin-polymyxin (NEOSPORIN) ointment   Topical TID Robert Covert, MD      . nicotine (NICODERM CQ - dosed in mg/24 hours) patch 21 mg  21 mg Transdermal Daily Robert Covert, MD   21 mg at 07/04/18 0826  . QUEtiapine (SEROQUEL) tablet 200 mg  200 mg Oral QHS Robert Covert, MD   200 mg at 07/03/18 2112  . sertraline (ZOLOFT) tablet 25 mg  25 mg Oral Daily Robert Covert, MD   25 mg at 07/04/18 0825  . tamsulosin (FLOMAX) capsule 0.4 mg  0.4 mg Oral Daily Robert Pour, NP   0.4 mg at 07/04/18 0825    Lab Results:  Results for orders placed or  performed during the hospital encounter of 07/02/18 (from the past 48 hour(s))  Basic metabolic panel     Status: Abnormal   Collection Time: 07/04/18  6:09 AM  Result Value Ref Range   Sodium 140 135 - 145 mmol/L   Potassium 3.9 3.5 - 5.1 mmol/L   Chloride 111 98 - 111 mmol/L   CO2 21 (L) 22 - 32 mmol/L   Glucose, Bld 95 70 - 99 mg/dL   BUN 29 (H) 6 - 20 mg/dL   Creatinine, Ser 1.33 (H) 0.61 - 1.24 mg/dL   Calcium 9.0 8.9 - 10.3  mg/dL   GFR calc non Af Amer >60 >60 mL/min   GFR calc Af Amer >60 >60 mL/min   Anion gap 8 5 - 15    Comment: Performed at [facility], [place] [address], [place], [place] [address]  Brain natriuretic peptide     Status: None   Collection Time: 07/04/18  6:09 AM  Result Value Ref Range   B Natriuretic Peptide 37.3 0.0 - 100.0 pg/mL    Comment: Performed at [place], [place] [address], [place], [place] [address]    Blood Alcohol level:  Lab Results  Component Value Date   ETH <10 62/95/2841    Metabolic Disorder Labs: No results found for: HGBA1C, MPG No results found for: PROLACTIN No results found for: CHOL, TRIG, HDL, CHOLHDL, VLDL, LDLCALC  Physical Findings: AIMS: Facial and Oral Movements Muscles of Facial Expression: None, normal Lips and Perioral Area: None, normal Jaw: None, normal Tongue: None, normal,Extremity Movements Upper (arms, wrists, hands, fingers): None, normal Lower (legs, knees, ankles, toes): None, normal, Trunk Movements Neck, shoulders, hips: None, normal, Overall Severity Severity of abnormal movements (highest score from questions above): None, normal Incapacitation due to abnormal movements: None, normal Patient's awareness of abnormal movements (rate only patient's report): No Awareness, Dental Status Current problems with teeth and/or dentures?: No Does patient usually wear dentures?: No  CIWA:    COWS:     Musculoskeletal: Strength & Muscle Tone: within normal limits Gait  & Station: normal Patient leans: N/A  Psychiatric Specialty Exam: Physical Exam  ROS-today denies headache, denies chest pain, no shortness of breath at room air, no vomiting  Blood pressure (!) 115/101, pulse 72, temperature 98.3 F (36.8 C), temperature source Oral, resp. rate 20, height _0  (1.626 m), weight 67.1 kg.Body mass index is 25.4 kg/m.  General Appearance: Fairly Groomed  Eye Contact: Fair eye contact  Speech: -Today verbal although continues to speak in brief phrases, mainly monosyllables  Volume:  Decreased  Mood: Denies depression and describes mood as" okay"  Affect: Smiles appropriately during session  Thought Process: Slowed  Orientation: Alert, partially oriented to place and time  Thought Content: Currently denies auditory hallucinations and does not appear internally preoccupied  Suicidal Thoughts: Denies suicidal or homicidal ideations-contracts for safety on unit  Homicidal Thoughts:  No   Memory: Recent and remote fair  Judgement:  Fair  Insight:  Fair  Psychomotor Activity: Presents calm and in no acute distress, no psychomotor agitation  Concentration:  Concentration: Fair  and Attention Span: Fair  Recall:  [employer] of Knowledge:  Fair  Language: Poor  Akathisia:  Negative  Handed:  Right  AIMS (if indicated):     Assets:  Desire for Improvement Resilience  ADL's:  Intact  Cognition:  WNL  Sleep:  Number of Hours: 6.25   Assessment:  Patient is a 53 year old male, history of methamphetamine dependence, history of substance-induced mood/psychotic  disorder.  Presented to the hospital reporting depression, suicidal thoughts, hallucinations (reports demeaning/insulting auditory hallucinations and fleeting visual hallucinations).  He reports he had been sober x7 months until recent relapse.  He had been treated with Seroquel in the past but was not taking prior to admission.   Today patient appears calm, in no acute distress, without psychomotor  agitation.  Today denies auditory hallucinations and does not currently present internally preoccupied and does not currently express any overt delusions . He had been selectively mute earlier in the week.  This has  improved partially but speech remains fair, answers questions mostly with monosyllables and short phrases.  There is no current catatonia and although spends most time in his room is noted to be more visible on unit and to get up from bed to use the bathroom and walk .  A recent head CT scan done on 3/19 was negative for hemorrhage or intracranial mass, positive for hypodensities on white matter possibly reflective of small vessel ischemic changes.   Work-up revealed hyperammonemia (of Shepherd alkaline phosphatase ,ALT, AST, bilirubin within normal) and today Creatinine is again trending up to 1.97  He has a history of hypertension, which has required management with several antihypertensive agents concomitantly.  Blood pressure has trended down with management , and RN reports decreased standing  BP ( 82/71) , but no increase in pulse ( 72) . Currently tolerating medications well, does not endorse medication side effects at present. I have reviewed BP management, labs and presentation with Dr. Bonner Puna, [place], - recommendation is to stop lisinopril and change hydralazine to PRN.   Treatment Plan Summary:  Treatment plan reviewed as below today 3/22  Daily contact with patient to assess and evaluate symptoms and progress in treatment, Medication management, Plan Inpatient treatment and Medications as below Encourage group and milieu participation to work on coping skills and symptom reduction Encourage efforts to work on sobriety and relapse prevention Increase Haldol to 2 mgrs QAM and 5 mgrs QHS for psychosis. Continue  Ativan 0.5 mgrs BID for anxiety, hold if sedated . Continue Zoloft 50 mg daily for depression Continue antihypertensive management with changes as recommended by Dr.  Bonner Puna, hospitalist . Continue  Lactulose for hyperammonemia. Continue Lipitor for management of hypercholesterolemia Treatment team working on disposition planning options CSW will work on contacting family in order to obtain further collateral information. Monitor Vitals Q 8 hours , including temperature  Encourage PO fluids  Recheck BMP in AM. Recheck Ammonia Serum Level in AM.  Patient ID: Robert Shepherd, male   DOB: 1966-02-19, 53 y.o.   MRN: [id number]

## 2018-07-08 NOTE — Progress Notes (Signed)
Write robserved patient up in the dayroom briefly watching tv but no interaction with peers. Writer informed him of his hs medications and he was compliant and took them. He did not present with any bizarre behavior on tonight. He returned to his room to rest after taking his medications. Fluids encouraged to support his low bp. Safety maintained on unit with 15 min checks.

## 2018-07-08 NOTE — [facility] Group Notes (Signed)
[facility] LCSW Group Therapy Note  Date/Time:  07/08/2018 9:00-10:00 or 10:00-11:00AM  Type of Therapy and Topic:  Group Therapy:  Healthy and Unhealthy Supports  Participation Level:  Did Not Attend   Description of Group:  Patients in this group were introduced to the idea of adding a variety of healthy supports to address the various needs in their lives.Patients discussed what additional healthy supports could be helpful in their recovery and wellness after discharge in order to prevent future hospitalizations.   An emphasis was placed on using counselor, doctor, therapy groups, 12-step groups, and problem-specific support groups to expand supports.  They also worked as a group on developing a specific plan for several patients to deal with unhealthy supports through [place], psychoeducation with loved ones, and even termination of relationships.   Therapeutic Goals:   1)  discuss importance of adding supports to stay well once out of the hospital  2)  compare healthy versus unhealthy supports and identify some examples of each  3)  generate ideas and descriptions of healthy supports that can be added  4)  offer mutual support about how to address unhealthy supports  5)  encourage active participation in and adherence to discharge plan    Summary of Patient Progress:  Did not attend  Therapeutic Modalities:   Motivational Interviewing Brief Solution-Focused Therapy  Rolanda Jay

## 2018-07-08 NOTE — Progress Notes (Signed)
No charge note: Discussed with Dr. Parke Poisson regarding blood pressure management and AKI. Pt becoming orthostatic though not hypotensive. He is tolerating po, able to drink when encouraged. With this and noting creatinine elevated 1.3 > 1.9 (previous baseline appears close to 1.6, as high as 2.06), will stop lisinopril (was given this morning) and change hydralazine to prn for systolic HTN. Aim to avoid relative renal hypoperfusion as well as severe HTN. Recommend recheck BMP 3/24.   Vance Gather, MD 07/08/2018 2:01 PM

## 2018-07-09 LAB — BASIC METABOLIC PANEL
Anion gap: 10 (ref 5–15)
BUN: 45 mg/dL — ABNORMAL HIGH (ref 6–20)
CO2: 18 mmol/L — ABNORMAL LOW (ref 22–32)
Calcium: 9.4 mg/dL (ref 8.9–10.3)
Chloride: 110 mmol/L (ref 98–111)
Creatinine, Ser: 1.79 mg/dL — ABNORMAL HIGH (ref 0.61–1.24)
GFR calc Af Amer: 49 mL/min — ABNORMAL LOW (ref >60)
GFR calc non Af Amer: 43 mL/min — ABNORMAL LOW (ref >60)
Glucose, Bld: 104 mg/dL — ABNORMAL HIGH (ref 70–99)
Potassium: 4.5 mmol/L (ref 3.5–5.1)
Sodium: 138 mmol/L (ref 135–145)

## 2018-07-09 LAB — HEPATIC FUNCTION PANEL
ALBUMIN: 4.5 g/dL (ref 3.5–5.0)
ALT: 40 U/L (ref 0–44)
AST: 22 U/L (ref 15–41)
Alkaline Phosphatase: 64 U/L (ref 38–126)
Bilirubin, Direct: 0.1 mg/dL (ref 0.0–0.2)
Indirect Bilirubin: 0.6 mg/dL (ref 0.3–0.9)
Total Bilirubin: 0.7 mg/dL (ref 0.3–1.2)
Total Protein: 7.9 g/dL (ref 6.5–8.1)

## 2018-07-09 LAB — AMMONIA: Ammonia: 48 umol/L — ABNORMAL HIGH (ref 9–35)

## 2018-07-09 NOTE — [facility] Group Notes (Signed)
Pt did not attend wrap up group this evening.  

## 2018-07-09 NOTE — Plan of Care (Addendum)
Patient was pleasant upon approach. Denies SI HI AVH, but did not get up to eat his breakfast or lunch. Patient did get up to eat dinner where he was seen smiling and laughing with peers and other staff members. Patient is compliant with medications, no side effects noted. Safety is maintained with 15 minute checks as well as environmental checks. Will continue to monitor.  Problem: Education: Goal: Emotional status will improve Outcome: Progressing Goal: Mental status will improve Outcome: Progressing Goal: Verbalization of understanding the information provided will improve Outcome: Progressing   Problem: Activity: Goal: Interest or engagement in activities will improve Outcome: Progressing

## 2018-07-09 NOTE — Progress Notes (Signed)
[facility] MD Progress Note  07/04/2018 11:15 AM Robert Shepherd  MRN:  [id number] Subjective: States "I am all right today".  Describes improving mood.  At this time denies suicidal ideations.  States auditory hallucinations much improved and none today thus far.  Objective: I have discussed case with treatment team and have met with patient.  Patient is a 53 year old male, history of methamphetamine dependence, history of substance-induced mood disorder and psychosis.  Presented to the hospital reporting depression, suicidal thoughts, hallucinations (reports demeaning/insulting auditory hallucinations and fleeting visual hallucinations).  He reports he had been sober x7 months until recent relapse.  He had been treated with Seroquel in the past but was not taking prior to admission.  As above, patient describes he is doing "all right" at this time.  Currently minimizes/denies suicidal ideations and describes improving mood.  Of note, no selective mutism noted today, and although still speaks softly he is noticeably more verbal at this time.  In fact, he states that recent episode of selective mutism was because he was experiencing auditory hallucinations/hearing voices "telling me not to talk". No disruptive or agitated behaviors on unit.  As he improves he is becoming more future oriented and continues to state that he plans to move to [place]( unsure of spelling ) , in [place], where he has family and more support.  He is currently not interested in going to rehab at discharge.  Blood pressure has also tended to stabilize.  Certainly significantly improved compared to recent hypertensive readings, today blood pressure 109/78 with a pulse of 75.  Does not endorse dizziness or lightheadedness today.  Labs reviewed-ammonia improved to 48.  Creatinine improved to 1.79 (from 1.97) BUN 45.  I have reviewed current presentation with hospitalist consultant, Dr. Bonner Puna.  Recommendation is to continue current  medication regimen.  Principal Problem: Substance (stimulant) dependence, substance-induced mood disorder, substance-induced psychosis. 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 Cancer ([place])   . Hypertension   . Renal disorder     Past Surgical History:  Procedure Laterality Date  . CARDIAC SURGERY    . CYSTOSCOPY    . LITHOTRIPSY     Family History: History reviewed. No pertinent family history. Family Psychiatric  History:  Social History:  Social History   Substance and Sexual Activity  Alcohol Use Not Currently     Social History   Substance and Sexual Activity  Drug Use Yes    Social History   Socioeconomic History  . Marital status: Single    Spouse name: Not on file  . Number of children: Not on file  . Years of education: Not on file  . Highest education level: Not on file  Occupational History  . Not on file  Social Needs  . Financial resource strain: Not on file  . Food insecurity:    Worry: Not on file    Inability: Not on file  . Transportation needs:    Medical: Not on file    Non-medical: Not on file  Tobacco Use  . Smoking status: Current Every Day Smoker    Types: E-cigarettes  . Smokeless tobacco: Former Systems developer    Types: Risk analyst  date: 05/12/2008  Substance and Sexual Activity  . Alcohol use: Not Currently  . Drug use: Yes  . Sexual activity: Not Currently  Lifestyle  . Physical activity:    Days per week: Not on file    Minutes per session: Not on file  . Stress: Not on file  Relationships  . Social connections:    Talks on phone: Not on file    Gets together: Not on file    Attends  religious service: Not on file    Active member of club or organization: Not on file    Attends meetings of clubs or organizations: Not on file    Relationship status: Not on file  Other Topics Concern  . Not on file  Social History Narrative  . Not on file   Additional Social History:    Sleep: Improving  Appetite: Fair but improving.  Continues to need some encouragement from staff in order to eat his meals.  Current Medications: Current Facility-Administered Medications  Medication Dose Route Frequency Provider Last Rate Last Dose  . acetaminophen (TYLENOL) tablet 650 mg  650 mg Oral Q6H PRN Patrecia Pour, NP      . albuterol (PROVENTIL HFA;VENTOLIN HFA) 108 (90 Base) MCG/ACT inhaler 2 puff  2 puff Inhalation Q6H PRN Patrecia Pour, NP      . alum & mag hydroxide-simeth (MAALOX/MYLANTA) 200-200-20 MG/5ML suspension 30 mL  30 mL Oral Q4H PRN Patrecia Pour, NP      . amLODipine (NORVASC) tablet 10 mg  10 mg Oral Daily Patrecia Pour, NP   10 mg at 07/04/18 0825  . atorvastatin (LIPITOR) tablet 40 mg  40 mg Oral QHS Patrecia Pour, NP   40 mg at 07/03/18 2112  . carvedilol (COREG) tablet 25 mg  25 mg Oral Daily Patrecia Pour, NP   25 mg at 07/04/18 0825  . cloNIDine (CATAPRES) tablet 0.3 mg  0.3 mg Oral TID Patrecia Pour, NP   0.3 mg at 07/04/18 0825  . lisinopril (PRINIVIL,ZESTRIL) tablet 20 mg  20 mg Oral Daily Patrecia Pour, NP   20 mg at 07/04/18 0825  . magnesium hydroxide (MILK OF MAGNESIA) suspension 30 mL  30 mL Oral Daily PRN Patrecia Pour, NP      . neomycin-bacitracin-polymyxin (NEOSPORIN) ointment   Topical TID Sharma Covert, MD      . nicotine (NICODERM CQ - dosed in mg/24 hours) patch 21 mg  21 mg Transdermal Daily Sharma Covert, MD   21 mg at 07/04/18 0826  . QUEtiapine (SEROQUEL) tablet 200 mg  200 mg Oral QHS Sharma Covert, MD   200 mg at 07/03/18 2112  . sertraline (ZOLOFT) tablet 25 mg  25 mg Oral Daily Sharma Covert, MD   25 mg at  07/04/18 0825  . tamsulosin (FLOMAX) capsule 0.4 mg  0.4 mg Oral Daily Patrecia Pour, NP   0.4 mg at 07/04/18 0825    Lab Results:  Results for orders placed or performed during the hospital encounter of 07/02/18 (from the past 48 hour(s))  Basic metabolic panel     Status: Abnormal   Collection Time: 07/04/18  6:09 AM  Result Value Ref Range   Sodium 140 135 - 145 mmol/L   Potassium 3.9 3.5 - 5.1 mmol/L   Chloride 111 98 - 111 mmol/L   CO2 21 (L) 22 - 32 mmol/L   Glucose, Bld 95 70 - 99 mg/dL  BUN 29 (H) 6 - 20 mg/dL   Creatinine, Ser 1.33 (H) 0.61 - 1.24 mg/dL   Calcium 9.0 8.9 - 10.3 mg/dL   GFR calc non Af Amer >60 >60 mL/min   GFR calc Af Amer >60 >60 mL/min   Anion gap 8 5 - 15    Comment: Performed at [facility], [place] [address] [place], [place] [address]  Brain natriuretic peptide     Status: None   Collection Time: 07/04/18  6:09 AM  Result Value Ref Range   B Natriuretic Peptide 37.3 0.0 - 100.0 pg/mL    Comment: Performed at [facility], [place] [address], [place], [place] [address]    Blood Alcohol level:  Lab Results  Component Value Date   ETH <10 41/32/4401    Metabolic Disorder Labs: No results found for: HGBA1C, MPG No results found for: PROLACTIN No results found for: CHOL, TRIG, HDL, CHOLHDL, VLDL, LDLCALC  Physical Findings: AIMS: Facial and Oral Movements Muscles of Facial Expression: None, normal Lips and Perioral Area: None, normal Jaw: None, normal Tongue: None, normal,Extremity Movements Upper (arms, wrists, hands, fingers): None, normal Lower (legs, knees, ankles, toes): None, normal, Trunk Movements Neck, shoulders, hips: None, normal, Overall Severity Severity of abnormal movements (highest score from questions above): None, normal Incapacitation due to abnormal movements: None, normal Patient's awareness of abnormal movements (rate only patient's report): No Awareness, Dental Status Current  problems with teeth and/or dentures?: No Does patient usually wear dentures?: No  CIWA:    COWS:     Musculoskeletal: Strength & Muscle Tone: within normal limits Gait & Station: normal Patient leans: N/A  Psychiatric Specialty Exam: Physical Exam  ROS-at this time denies headache, no chest pain, no shortness of breath, no vomiting, no fever, no chills.  Blood pressure (!) 115/101, pulse 72, temperature 98.3 F (36.8 C), temperature source Oral, resp. rate 20, height '5\' 4"'$  (1.626 m), weight 67.1 kg.Body mass index is 25.4 kg/m.  General Appearance: Fairly groomed  Eye Contact:Improving eye contact  Speech: -More verbal today  Volume:  Decreased  Mood: Denies depression and describes his mood as "okay"  Affect: More appropriate/reactive today.  Smiles appropriately at times during session  Thought Process: Today seems linear and less slowed  Orientation: Orientation appears improved.  Today is fully alert, attentive, is oriented to July 09, 2018/Spring- not to day of week.  Oriented to [place].  Thought Content: Today denies hallucinations and currently does not appear internally preoccupied  Suicidal Thoughts: Denies suicidal or homicidal ideations-contracts for safety on unit  Homicidal Thoughts:  No   Memory: Also improving-recall today 3 out of 3 immediate and 3 out of 3 at 3 minutes  Judgement:  Fair  Insight:  Fair  Psychomotor Activity: Calm, no psychomotor agitation  Concentration:  Concentration: improving  and Attention Span: improving   Recall:  Improving   Fund of Knowledge:  Improving   Language: Poor  Akathisia:  Negative  Handed:  Right  AIMS (if indicated):     Assets:  Desire for Improvement Resilience  ADL's:  Intact  Cognition:  WNL  Sleep:  Number of Hours: 6.25   Assessment:  Patient is a 53 year old male, history of methamphetamine dependence, history of substance-induced mood/psychotic  disorder.  Presented to the  hospital reporting depression, suicidal thoughts, hallucinations (reports demeaning/insulting auditory hallucinations and fleeting visual hallucinations).  He reports he had been sober x7 months until recent relapse.  He had been  treated with Seroquel in the past but was not taking prior to admission.   Patient improving compared to his prior presentation.  His overall relatedness is improved and today is not selectively mute and presenting with improved eye contact, more reactive affect.  Describes improving/resolving auditory hallucinations and today states that his recent selective mutism was related to active hallucinations at the time.  Cognition/mentation appears partially improved-currently he is alert, attentive, fully oriented x3 and recall seems improved.  This is coinciding with a decreasing ammonia serum level.  His blood pressure has fluctuated but overall the trend has been towards improvement of significant/severe hypertension.  His blood pressure today is 109/78 which she is currently tolerating well.  Denies dizziness or lightheadedness   Treatment Plan Summary:  Treatment plan reviewed as below today 3/23 Daily contact with patient to assess and evaluate symptoms and progress in treatment, Medication management, Plan Inpatient treatment and Medications as below Encourage group and milieu participation to work on coping skills and symptom reduction Encourage efforts to work on sobriety and relapse prevention Continue Haldol 2 mgrs QAM and 5 mgrs QHS for psychosis. Continue  Ativan 0.5 mgrs BID for anxiety, hold if sedated . Continue Zoloft 50 mg daily for depression Continue current antihypertensive management , as reviewed with hospitalist consultant Continue  Lactulose for hyperammonemia. Continue Lipitor for management of hypercholesterolemia Treatment team working on disposition planning options CSW will work on contacting family in order to obtain further collateral  information. Monitor Vitals Q 8 hours , including temperature  Encourage PO fluids    Patient ID: Robert Shepherd, male   DOB: 06/12/1965, 53 y.o.   MRN: [id number]

## 2018-07-09 NOTE — Tx Team (Signed)
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 acetaminophen (TYLENOL) tablet 650 mg  650 mg Oral Q6H PRN Patrecia Pour, NP      . albuterol (PROVENTIL HFA;VENTOLIN HFA) 108 (90 Base) MCG/ACT inhaler 2 puff  2 puff Inhalation Q6H PRN Patrecia Pour, NP      . alum & mag hydroxide-simeth (MAALOX/MYLANTA) 200-200-20 MG/5ML suspension 30 mL  30 mL Oral Q4H PRN Patrecia Pour, NP      . amLODipine (NORVASC) tablet 10 mg  10 mg Oral Daily Patrecia Pour, NP   10 mg at 07/09/18 0177  . atorvastatin (LIPITOR) tablet 40 mg  40 mg Oral QHS Patrecia Pour, NP   40 mg at 07/08/18 2119  . carvedilol (COREG) tablet 25 mg  25 mg Oral Daily Patrecia Pour, NP   25 mg at 07/09/18 9390  . cloNIDine (CATAPRES) tablet 0.2 mg  0.2 mg Oral TID Cobos, Myer Peer, MD   0.2 mg at 07/09/18 3009  . haloperidol (HALDOL) tablet 2 mg  2 mg Oral BH-[id number] Cobos, Myer Peer, MD   2 mg at 07/09/18 2330  . haloperidol (HALDOL) tablet 5 mg  5 mg Oral QHS Cobos, Myer Peer, MD   5 mg at 07/08/18 2118  . hydrALAZINE (APRESOLINE) tablet 25 mg  25 mg Oral Q8H PRN Patrecia Pour, MD      . lactulose (CHRONULAC) 10 GM/15ML solution 10 g  10 g Oral TID Patriciaann Clan E, PA-C   10 g at 07/09/18 0803  . LORazepam (ATIVAN) tablet 0.5 mg  0.5 mg Oral Q6H PRN Cobos, Myer Peer, MD   0.5 mg at 07/08/18 2118  . LORazepam (ATIVAN) tablet 0.5 mg  0.5 mg Oral BID Cobos, Myer Peer, MD   0.5 mg at 07/09/18 0803  . magnesium  hydroxide (MILK OF MAGNESIA) suspension 30 mL  30 mL Oral Daily PRN Patrecia Pour, NP      . neomycin-bacitracin-polymyxin (NEOSPORIN) ointment   Topical TID Sharma Covert, MD      . nicotine (NICODERM CQ - dosed in mg/24 hours) patch 21 mg  21 mg Transdermal Daily Sharma Covert, MD   21 mg at 07/09/18 0802  . sertraline (ZOLOFT) tablet 50 mg  50 mg Oral Daily Cobos, Myer Peer, MD   50 mg at 07/09/18 0803  . tamsulosin (FLOMAX) capsule 0.4 mg  0.4 mg Oral Daily Patrecia Pour, NP   0.4 mg at 07/09/18 0803   PTA Medications: Medications Prior to Admission  Medication Sig Dispense Refill Last Dose  . acetaminophen (TYLENOL) 325 MG tablet Take 650 mg by mouth every 6 (six) hours as needed for headache (pain).   06/28/2018  . albuterol (PROVENTIL HFA;VENTOLIN HFA) 108 (90 Base) MCG/ACT inhaler Inhale 2 puffs into the lungs every 6 (six) hours as needed for wheezing or shortness of breath.   07/01/2018  . amLODipine (NORVASC) 10 MG tablet Take 1 tablet (10 mg total) by mouth daily. 30 tablet 1 4-5 days ago  . atorvastatin (LIPITOR) 40 MG tablet Take 1  tablet (40 mg total) by mouth daily. (Patient taking differently: Take 40 mg by mouth at bedtime. ) 30 tablet 1 06/30/2018  . carvedilol (COREG) 25 MG tablet Take 1 tablet (25 mg total) by mouth 2 (two) times daily with a meal. (Patient taking differently: Take 25 mg by mouth daily. ) 60 tablet 1 07/01/2018 at 8am  . cloNIDine (CATAPRES) 0.3 MG tablet Take 1 tablet (0.3 mg total) by mouth 3 (three) times daily for 30 days. 90 tablet 0 07/01/2018  . ibuprofen (ADVIL,MOTRIN) 200 MG tablet Take 400 mg by mouth every 6 (six) hours as needed for headache (pain).   06/29/2018  . lisinopril (PRINIVIL,ZESTRIL) 20 MG tablet Take 1 tablet (20 mg total) by mouth 2 (two) times daily. 60 tablet 0 07/01/2018  . tamsulosin (FLOMAX) 0.4 MG CAPS capsule Take 1 capsule (0.4 mg total) by mouth daily. 30 capsule 1 06/30/2018    Patient Stressors: Financial  difficulties Medication change or noncompliance Substance abuse  Patient Strengths: Ability for insight Average or above average intelligence Capable of independent living [employer] of knowledge Motivation for treatment/growth  Treatment Modalities: Medication Management, Group therapy, Case management,  1 to 1 session with clinician, Psychoeducation, Recreational therapy.   Physician Treatment Plan for Primary Diagnosis: <principal problem not specified> Long Term Goal(s): Improvement in symptoms so as ready for discharge Improvement in symptoms so as ready for discharge   Short Term Goals: Ability to identify changes in lifestyle to reduce recurrence of condition will improve Ability to verbalize feelings will improve Ability to disclose and discuss suicidal ideas Ability to demonstrate self-control will improve Ability to identify and develop effective coping behaviors will improve Ability to maintain clinical measurements within normal limits will improve Compliance with prescribed medications will improve Ability to identify triggers associated with substance abuse/mental health issues will improve Ability to identify changes in lifestyle to reduce recurrence of condition will improve Ability to verbalize feelings will improve Ability to disclose and discuss suicidal ideas Ability to demonstrate self-control will improve Ability to identify and develop effective coping behaviors will improve Ability to maintain clinical measurements within normal limits will improve Compliance with prescribed medications will improve Ability to identify triggers associated with substance abuse/mental health issues will improve  Medication Management: Evaluate patient's response, side effects, and tolerance of medication regimen.  Therapeutic Interventions: 1 to 1 sessions, Unit Group sessions and Medication administration.  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[place])  Long Term Goal(s): Improvement in symptoms so as ready for discharge Improvement in symptoms so as ready for discharge   Short Term Goals: Ability to identify changes in lifestyle to reduce recurrence of condition will improve Ability to verbalize feelings will improve Ability to disclose and discuss suicidal ideas Ability to demonstrate self-control will improve Ability to identify and develop effective coping behaviors will improve Ability to maintain clinical measurements within normal limits will improve Compliance with prescribed medications will improve Ability to identify triggers associated with substance abuse/mental health issues will improve Ability to identify changes in lifestyle to reduce recurrence of condition will improve Ability to verbalize feelings will improve Ability to disclose and discuss suicidal ideas Ability to demonstrate self-control will improve Ability to identify and develop effective coping behaviors will improve Ability to maintain clinical measurements within normal  limits will improve Compliance with prescribed medications will improve Ability to identify triggers associated with substance abuse/mental health issues will improve     Medication Management: Evaluate patient's response, side effects, and tolerance of medication regimen.  Therapeutic Interventions: 1 to 1 sessions, Unit Group sessions and Medication administration.  Evaluation of Outcomes: Progressing   RN Treatment Plan for Primary Diagnosis: <principal problem not specified> Long Term Goal(s): Knowledge of disease and therapeutic regimen to maintain health will improve  Short Term Goals: Ability to verbalize feelings will  improve and Ability to identify and develop effective coping behaviors will improve  Medication Management: RN will administer medications as ordered by provider, will assess and evaluate patient's response and provide education to patient for prescribed medication. RN will report any adverse and/or side effects to prescribing provider.  Therapeutic Interventions: 1 on 1 counseling sessions, Psychoeducation, Medication administration, Evaluate responses to treatment, Monitor vital signs and CBGs as ordered, Perform/monitor CIWA, COWS, AIMS and Fall Risk screenings as ordered, Perform wound care treatments as ordered.  Evaluation of Outcomes: Progressing   LCSW Treatment Plan for Primary Diagnosis: <principal problem not specified> Long Term Goal(s): Safe transition to appropriate next level of care at discharge, Engage patient in therapeutic group addressing interpersonal concerns.  Short Term Goals: Engage patient in aftercare planning with referrals and resources, Increase social support, Increase emotional regulation, Identify triggers associated with mental health/substance abuse issues and Increase skills for wellness and recovery  Therapeutic Interventions: Assess for all discharge needs, 1 to 1 time with Social worker, Explore available resources and support systems, Assess for adequacy in community support network, Educate family and significant other(s) on suicide prevention, Complete Psychosocial Assessment, Interpersonal group therapy.  Evaluation of Outcomes: Progressing   Progress in Treatment: Attending groups: Yes. Participating in groups: Yes. Taking medication as prescribed: Yes. Toleration medication: Yes. Family/Significant other contact made: No, will contact:  Patient unsure if he wants his family to know about his hospitalization. CSW will follow up Patient understands diagnosis: Yes. Discussing patient identified problems/goals with staff: Yes. Medical problems  stabilized or resolved: No. Denies suicidal/homicidal ideation: No. Issues/concerns per patient self-inventory: Yes.  New problem(s) identified: Yes, Describe:  patient homeless, limited supports  New Short Term/Long Term Goal(s): detox, medication management for mood stabilization; elimination of SI thoughts; development of comprehensive mental wellness/sobriety plan.  Patient Goals:  Wants to get stabilized on medications then go into residential treatment  Discharge Plan or Barriers: Has a [facility] intake scheduled for Wednesday, 07/11/2018.  Reason for Continuation of Hospitalization: Anxiety Depression Hallucinations Suicidal ideation  Estimated Length of Stay: discharge 07/09/2018  Attendees: Patient: Robert Shepherd 07/09/2018 11:29 AM  Physician: Larene Beach 07/09/2018 11:29 AM  Nursing: Yetta Flock.Lenna Sciara RN 07/09/2018 11:29 AM  RN Care Manager: 07/09/2018 11:29 AM  Social Worker: Stephanie Acre, [employer]; [place], [place] 07/09/2018 11:29 AM  Recreational Therapist:  07/09/2018 11:29 AM  Other:  07/09/2018 11:29 AM  Other:  07/09/2018 11:29 AM  Other: 07/09/2018 11:29 AM    Scribe for Treatment Team: Marylee Floras, [place] 07/09/2018 11:29 AM

## 2018-07-10 MED ORDER — CLONIDINE HCL 0.2 MG PO TABS
0.3000 mg | ORAL_TABLET | Freq: Three times a day (TID) | ORAL | Status: DC
  Administered 2018-07-10 – 2018-07-11 (×4): 0.3 mg via ORAL
  Filled 2018-07-10 (×2): qty 1
  Filled 2018-07-10: qty 63
  Filled 2018-07-10: qty 1
  Filled 2018-07-10 (×2): qty 63
  Filled 2018-07-10 (×5): qty 1
  Filled 2018-07-10 (×3): qty 63

## 2018-07-10 MED ORDER — SERTRALINE HCL 50 MG PO TABS
50.0000 mg | ORAL_TABLET | Freq: Once | ORAL | Status: AC
  Administered 2018-07-10: 50 mg via ORAL
  Filled 2018-07-10: qty 1

## 2018-07-10 MED ORDER — HALOPERIDOL 5 MG PO TABS
10.0000 mg | ORAL_TABLET | Freq: Every day | ORAL | Status: DC
  Administered 2018-07-10 – 2018-07-11 (×2): 10 mg via ORAL
  Filled 2018-07-10: qty 2
  Filled 2018-07-10 (×2): qty 28
  Filled 2018-07-10 (×2): qty 2

## 2018-07-10 MED ORDER — SERTRALINE HCL 100 MG PO TABS
100.0000 mg | ORAL_TABLET | Freq: Every day | ORAL | Status: DC
  Administered 2018-07-11: 100 mg via ORAL
  Filled 2018-07-10 (×2): qty 14
  Filled 2018-07-10 (×3): qty 1

## 2018-07-10 NOTE — Plan of Care (Signed)
  Problem: Activity: Goal: Interest or engagement in activities will improve Outcome: Not Progressing   Problem: Physical Regulation: Goal: Ability to maintain clinical measurements within normal limits will improve Outcome: Not Progressing   Problem: Safety: Goal: Periods of time without injury will increase Outcome: Progressing [place] Note:  Patient presents with f;at affect and depressed mood.  Denies suicidal thoughts but reports auditory hallucination.  Medication given as prescribed.  Patient continues to isolates and withdrawn to self.  Minimal interaction with staff.  Forward little information during assessment.  Offered support and encouragement as needed.  Routine safety checks maintained every 15 minutes.  Patient is safe on the unit.

## 2018-07-10 NOTE — Plan of Care (Signed)
D: Patient is alert and cooperative. Endorses AH described as voices telling him to hurt himself. Denies SI, HI, and verbally contracts for safety. Patient denies physical symptoms/pain.    A: Medications administered per MD order. Support provided. Patient educated on safety on the unit and medications. Routine safety checks every 15 minutes. Patient stated understanding to tell nurse about any new physical symptoms. Patient understands to tell staff of any needs.     R: No adverse drug reactions noted. Patient verbally contracts for safety. Patient remains safe at this time and will continue to monitor.   Problem: Safety: Goal: Periods of time without injury will increase Outcome: Progressing   Patient remains safe and will continue to monitor.

## 2018-07-10 NOTE — Progress Notes (Signed)
[facility] MD Progress Note  07/10/2018 12:27 PM Robert Shepherd  MRN:  [id number] Subjective: Patient is a 53 year old male who was admitted on 07/03/2018 with suicidal ideation.  He had recently relapsed on methamphetamines.  He also has a diagnosis of substance-induced mood disorder, substance-induced psychotic disorder.  He had been staying in a sober living house, but had relapsed.  Objective: Patient is seen and examined.  Patient is a 53 year old male with the above-stated past psychiatric history who is seen in follow-up.  He stated he is basically unchanged.  He stated he still hearing voices.  He stated that he has always heard voices, and they never go away.  He remains in his room, is not really participating in groups.  On admission I restarted his Seroquel, as well as Zoloft.  Review of the notes since that point showed that he remained depressed.  During the course the hospitalization his Seroquel was increased to 300 mg p.o. nightly, and Ativan 1 mg every 6 hours as needed anxiety were added.  His Zoloft was continued.  On 3/20 patient had a change in mental status, and consultation was obtained.  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  At that time he stated he still was having depression but denied suicidal ideation.  He stated he wanted to return to [place] after discharge.  The mutism was not associated with any muscular rigidity or symptoms of catatonia.  His CK was normal.  He was on lactulose because of a history of liver disease.  The decision was made at that time to switch him from Seroquel to Haldol.  Zoloft was continued.  On 3/21 his Haldol was increased to 2 mg p.o. 3 times daily for psychosis.  There were no other changes to his medications.  On 3/22 his liver function enzymes were normal, but he continued to having evaded ammonia levels.  His blood pressure was also unstable.  He had orthostatic hypotension.  Lisinopril was stopped and switched to hydralazine PRN.   His Haldol was increased to 2 mg p.o. every morning and 5 mg nightly.  Patient today appears to be essentially unchanged from previous.  Very little spontaneous speech.  He stated the auditory hallucinations never go away.  His blood pressure has been somewhat labile, but more on the low side.  He does appear to have some orthostasis, but he is on calcium channel blockers and beta-blockers as well as alpha adrenergic agents as well.  Review of his laboratories reveal his last ammonia at 48, normal liver function enzymes, a creatinine of 1.79.  This is down from a previous 1.97.  His CT scan of the head on 3/19 was negative for hemorrhage or intracranial mass.  There was a small foci of hypodensity within the white matter.  It was thought to reflect small vessel ischemic change or age indeterminate lacunar infarcts.  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 Cancer ([place])   . Hypertension   . Renal disorder     Past Surgical History:  Procedure Laterality Date  . CARDIAC SURGERY    . CYSTOSCOPY    . LITHOTRIPSY  Family History: History reviewed. No pertinent family history. Family Psychiatric  History: See admission H&P Social History:  Social History   Substance and Sexual Activity  Alcohol Use Not Currently     Social History   Substance and Sexual Activity  Drug Use Yes    Social History   Socioeconomic History  . Marital status: Single    Spouse name: Not on file  . Number of children: Not on file  . Years of education: Not on file  . Highest education level:  Not on file  Occupational History  . Not on file  Social Needs  . Financial resource strain: Not on file  . Food insecurity:    Worry: Not on file    Inability: Not on file  . Transportation needs:    Medical: Not on file    Non-medical: Not on file  Tobacco Use  . Smoking status: Current Every Day Smoker    Types: E-cigarettes  . Smokeless tobacco: Former Systems developer    Types: [place] date: 05/12/2008  Substance and Sexual Activity  . Alcohol use: Not Currently  . Drug use: Yes  . Sexual activity: Not Currently  Lifestyle  . Physical activity:    Days per week: Not on file    Minutes per session: Not on file  . Stress: Not on file  Relationships  . Social connections:    Talks on phone: Not on file    Gets together: Not on file    Attends religious service: Not on file    Active member of club or organization: Not on file    Attends meetings of clubs or organizations: Not on file    Relationship status: Not on file  Other Topics Concern  . Not on file  Social History Narrative  . Not on file   Additional Social History:                         Sleep: Fair  Appetite:  Poor  Current Medications: Current Facility-Administered Medications  Medication Dose Route Frequency Provider Last Rate Last Dose  . acetaminophen (TYLENOL) tablet 650 mg  650 mg Oral Q6H PRN Patrecia Pour, NP   650 mg at 07/09/18 1718  . albuterol (PROVENTIL HFA;VENTOLIN HFA) 108 (90 Base) MCG/ACT inhaler 2 puff  2 puff Inhalation Q6H PRN Patrecia Pour, NP      . alum & mag hydroxide-simeth (MAALOX/MYLANTA) 200-200-20 MG/5ML suspension 30 mL  30 mL Oral Q4H PRN Patrecia Pour, NP      . amLODipine (NORVASC) tablet 10 mg  10 mg Oral Daily Patrecia Pour, NP   10 mg at 07/10/18 0806  . atorvastatin (LIPITOR) tablet 40 mg  40 mg Oral QHS Patrecia Pour, NP   40 mg at 07/09/18 2137  . carvedilol (COREG) tablet 25 mg  25 mg Oral Daily Patrecia Pour, NP   25 mg at 07/10/18 1025  .  cloNIDine (CATAPRES) tablet 0.3 mg  0.3 mg Oral TID Sharma Covert, MD      . haloperidol (HALDOL) tablet 10 mg  10 mg Oral QHS Sharma Covert, MD      . haloperidol (HALDOL) tablet 2 mg  2 mg Oral BH-[id number] Cobos, Myer Peer, MD   2 mg at 07/10/18 0806  . lactulose (CHRONULAC) 10 GM/15ML solution 10 g  10 g Oral TID Patriciaann Clan E, PA-C   10 g at  07/10/18 1159  . LORazepam (ATIVAN) tablet 0.5 mg  0.5 mg Oral Q6H PRN Cobos, Myer Peer, MD   0.5 mg at 07/09/18 2138  . magnesium hydroxide (MILK OF MAGNESIA) suspension 30 mL  30 mL Oral Daily PRN Patrecia Pour, NP      . neomycin-bacitracin-polymyxin (NEOSPORIN) ointment   Topical TID Sharma Covert, MD      . nicotine (NICODERM CQ - dosed in mg/24 hours) patch 21 mg  21 mg Transdermal Daily Sharma Covert, MD   21 mg at 07/10/18 2376  . sertraline (ZOLOFT) tablet 50 mg  50 mg Oral Daily Cobos, Myer Peer, MD   50 mg at 07/10/18 0807  . tamsulosin (FLOMAX) capsule 0.4 mg  0.4 mg Oral Daily Patrecia Pour, NP   0.4 mg at 07/10/18 2831    Lab Results:  Results for orders placed or performed during the hospital encounter of 07/02/18 (from the past 48 hour(s))  Basic metabolic panel     Status: Abnormal   Collection Time: 07/09/18  6:15 AM  Result Value Ref Range   Sodium 138 135 - 145 mmol/L   Potassium 4.5 3.5 - 5.1 mmol/L   Chloride 110 98 - 111 mmol/L   CO2 18 (L) 22 - 32 mmol/L   Glucose, Bld 104 (H) 70 - 99 mg/dL   BUN 45 (H) 6 - 20 mg/dL   Creatinine, Ser 1.79 (H) 0.61 - 1.24 mg/dL   Calcium 9.4 8.9 - 10.3 mg/dL   GFR calc non Af Amer 43 (L) >60 mL/min   GFR calc Af Amer 49 (L) >60 mL/min   Anion gap 10 5 - 15    Comment: Performed at [facility], [place] [address], [place], [place] [address]  Ammonia     Status: Abnormal   Collection Time: 07/09/18  6:15 AM  Result Value Ref Range   Ammonia 48 (H) 9 - 35 umol/L    Comment: Performed at [facility], [place] [address]  [place], [place] [address]  Hepatic function panel     Status: None   Collection Time: 07/09/18  6:15 AM  Result Value Ref Range   Total Protein 7.9 6.5 - 8.1 g/dL   Albumin 4.5 3.5 - 5.0 g/dL   AST 22 15 - 41 U/L   ALT 40 0 - 44 U/L   Alkaline Phosphatase 64 38 - 126 U/L   Total Bilirubin 0.7 0.3 - 1.2 mg/dL   Bilirubin, Direct 0.1 0.0 - 0.2 mg/dL   Indirect Bilirubin 0.6 0.3 - 0.9 mg/dL    Comment: Performed at [facility], [place] [address], [place], [place] [address]    Blood Alcohol level:  Lab Results  Component Value Date   ETH <10 94/85/4627    Metabolic Disorder Labs: Lab Results  Component Value Date   HGBA1C 5.3 07/05/2018   MPG 105.41 07/05/2018   No results found for: PROLACTIN Lab Results  Component Value Date   CHOL 162 07/05/2018   TRIG 127 07/05/2018   HDL 35 (L) 07/05/2018   CHOLHDL 4.6 07/05/2018   VLDL 25 07/05/2018   LDLCALC 102 (H) 07/05/2018    Physical Findings: AIMS: Facial and Oral Movements Muscles of Facial Expression: None, normal Lips and Perioral Area: None, normal Jaw: None, normal Tongue: None, normal,Extremity Movements Upper (arms, wrists, hands, fingers): None, normal Lower (legs, knees, ankles, toes): None, normal, Trunk Movements Neck, shoulders, hips: None, normal, Overall Severity Severity of  abnormal movements (highest score from questions above): None, normal Incapacitation due to abnormal movements: None, normal Patient's awareness of abnormal movements (rate only patient's report): No Awareness, Dental Status Current problems with teeth and/or dentures?: No Does patient usually wear dentures?: No  CIWA:  CIWA-Ar Total: 5 COWS:     Musculoskeletal: Strength & Muscle Tone: within normal limits Gait & Station: normal Patient leans: N/A  Psychiatric Specialty Exam: Physical Exam  Nursing note and vitals reviewed. Constitutional: He is oriented to person, place, and time. He appears well-developed and  well-nourished.  HENT:  Head: Normocephalic and atraumatic.  Respiratory: Effort normal.  Neurological: He is alert and oriented to person, place, and time.    ROS  Blood pressure 102/76, pulse 72, temperature 98.2 F (36.8 C), resp. rate 18, height 5\' 4"  (1.626 m), weight 68 kg, SpO2 97 %.Body mass index is 25.75 kg/m.  General Appearance: Disheveled  Eye Contact:  Minimal  Speech:  Normal Rate  Volume:  Decreased  Mood:  Anxious and Dysphoric  Affect:  Congruent  Thought Process:  Coherent and Descriptions of Associations: Circumstantial  Orientation:  Full (Time, Place, and Person)  Thought Content:  Hallucinations: Auditory  Suicidal Thoughts:  Yes.  without intent/plan  Homicidal Thoughts:  No  Memory:  Immediate;   Poor Recent;   Poor Remote;   Poor  Judgement:  Impaired  Insight:  Fair  Psychomotor Activity:  Psychomotor Retardation  Concentration:  Concentration: Fair and Attention Span: Fair  Recall:  [employer] of Knowledge:  Fair  Language:  Fair  Akathisia:  Negative  Handed:  Right  AIMS (if indicated):     Assets:  Desire for Improvement Resilience  ADL's:  Intact  Cognition:  WNL  Sleep:  Number of Hours: 4.5     Treatment Plan Summary: Daily contact with patient to assess and evaluate symptoms and progress in treatment, Medication management and Plan : Patient is seen and examined.  Patient is a 53 year old male with the above-stated past psychiatric history is seen in follow-up.  Unfortunately the patient looks essentially the same as where he was when I admitted him.  He is not sleeping, and has been switched to Haldol.  I will increase the Haldol to 2 mg p.o. daily and 10 mg p.o. nightly.  I am also going to increase his Zoloft to 100 mg p.o. daily.  I am going to recheck his thyroid secondary to previous hyperthyroidism and see if this is worsening.  With regard to his hypertension, I am going to just increase his clonidine to 0.3 mg p.o. 3 times daily  and see if that stabilizes things so that we do not have to use as needed's.  No change to his Norvasc or Coreg.  Hopefully we can get things turned in the right direction. 1.  Continue amlodipine 10 mg p.o. daily for hypertension. 2.  Continue Ventolin HFA for COPD. 3.  Continue Lipitor 40 mg p.o. nightly for hyperlipidemia. 4.  Continue carvedilol 25 mg p.o. daily for hypertension and heart health. 5.  Continue clonidine 0.3 mg p.o. 3 times daily.  This is for hypertension. 6.  Increase haloperidol to 2 mg p.o. daily and 10 mg p.o. nightly for psychosis. 7.  Continue lactulose 10 g p.o. 3 times daily for increased ammonia. 8.  Stop lorazepam 0.5 mg p.o. twice daily, but continue lorazepam 0.5 mg every 6 hours as needed anxiety. 9.  Increase Zoloft to 100 mg p.o. daily for mood and  anxiety. 10.  Continue Flomax 0.4 mg p.o. daily for enlarged prostate 11.  Disposition planning-in progress.  Sharma Covert, MD 07/10/2018, 12:27 PM

## 2018-07-11 MED ORDER — TAMSULOSIN HCL 0.4 MG PO CAPS: 0.4000 mg | ORAL_CAPSULE | Freq: Every day | ORAL | 0 refills | Status: AC

## 2018-07-11 MED ORDER — BACITRACIN-NEOMYCIN-POLYMYXIN OINTMENT TUBE: 1.0000 "application " | TOPICAL_OINTMENT | Freq: Three times a day (TID) | CUTANEOUS | 0 refills | Status: AC

## 2018-07-11 MED ORDER — LACTULOSE 10 GM/15ML PO SOLN: 30.0000 g | Freq: Every day | ORAL | 0 refills | Status: AC

## 2018-07-11 MED ORDER — TRAZODONE HCL 50 MG PO TABS
50.0000 mg | ORAL_TABLET | Freq: Every evening | ORAL | Status: DC | PRN
  Administered 2018-07-11: 50 mg via ORAL
  Filled 2018-07-11: qty 1

## 2018-07-11 MED ORDER — NICOTINE 21 MG/24HR TD PT24: 21.0000 mg | MEDICATED_PATCH | Freq: Every day | TRANSDERMAL | 0 refills | Status: AC

## 2018-07-11 MED ORDER — LACTULOSE 10 GM/15ML PO SOLN
10.0000 g | Freq: Every day | ORAL | Status: DC
  Filled 2018-07-11: qty 210

## 2018-07-11 MED ORDER — HALOPERIDOL 2 MG PO TABS: 2.0000 mg | ORAL_TABLET | ORAL | 0 refills | Status: AC

## 2018-07-11 MED ORDER — LACTULOSE 10 GM/15ML PO SOLN
30.0000 g | Freq: Every day | ORAL | Status: DC
  Filled 2018-07-11 (×3): qty 45

## 2018-07-11 MED ORDER — ATORVASTATIN CALCIUM 40 MG PO TABS: 40.0000 mg | ORAL_TABLET | Freq: Every day | ORAL | 0 refills | Status: AC

## 2018-07-11 MED ORDER — AMLODIPINE BESYLATE 10 MG PO TABS: 10.0000 mg | ORAL_TABLET | Freq: Every day | ORAL | 0 refills | Status: AC

## 2018-07-11 MED ORDER — TRAZODONE HCL 100 MG PO TABS: 100.0000 mg | ORAL_TABLET | Freq: Every evening | ORAL | 0 refills | Status: AC | PRN

## 2018-07-11 MED ORDER — CLONIDINE HCL 0.3 MG PO TABS: 0.3000 mg | ORAL_TABLET | Freq: Three times a day (TID) | ORAL | 0 refills | Status: AC

## 2018-07-11 MED ORDER — SERTRALINE HCL 100 MG PO TABS: 100.0000 mg | ORAL_TABLET | Freq: Every day | ORAL | 0 refills | Status: AC

## 2018-07-11 MED ORDER — HALOPERIDOL 10 MG PO TABS: 10.0000 mg | ORAL_TABLET | Freq: Every day | ORAL | 0 refills | Status: DC

## 2018-07-11 MED ORDER — TRAZODONE HCL 100 MG PO TABS
100.0000 mg | ORAL_TABLET | Freq: Every evening | ORAL | Status: DC | PRN
  Administered 2018-07-11 (×2): 100 mg via ORAL
  Filled 2018-07-11: qty 28
  Filled 2018-07-11: qty 1

## 2018-07-11 MED ORDER — HALOPERIDOL 10 MG PO TABS: 10.0000 mg | ORAL_TABLET | Freq: Every day | ORAL | 0 refills | Status: AC

## 2018-07-11 MED ORDER — LACTULOSE 10 GM/15ML PO SOLN
20.0000 g | Freq: Every day | ORAL | Status: DC
  Filled 2018-07-11: qty 420

## 2018-07-11 MED ORDER — CARVEDILOL 25 MG PO TABS: 25.0000 mg | ORAL_TABLET | Freq: Every day | ORAL | 0 refills | Status: AC

## 2018-07-11 MED ORDER — ALBUTEROL SULFATE HFA 108 (90 BASE) MCG/ACT IN AERS: 2.0000 | INHALATION_SPRAY | Freq: Four times a day (QID) | RESPIRATORY_TRACT | 0 refills | Status: AC | PRN

## 2018-07-11 NOTE — Plan of Care (Signed)
  Problem: Activity: Goal: Interest or engagement in activities will improve Outcome: Not Progressing   D: Pt alert and oriented on the unit. Pt engaging with RN staff and other pts. Pt denies SI/HI, A/VH. Pt isolated in his room for most of the day asleep in his bed. Pt did not participate during unit groups and activities. Pt is pleasant and cooperative. A: Education, support and encouragement provided, q15 minute safety checks remain in effect. Medications administered per MD orders. R: No reactions/side effects to medicine noted. Pt denies any concerns at this time, and verbally contracts for safety.Pt ambulating on the unit with no issues. Pt remains safe on the unit.

## 2018-07-11 NOTE — Discharge Summary (Signed)
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  He was diagnosed with amphetamine-induced disorders as well as amphetamine dependence at that time.  He was discharged on Seroquel.  He has not been in psychiatric follow-up after being discharged from the hospital.  He has reportedly been off his medications for at least 6 months.  Past Medical History:  Past Medical History:  Diagnosis Date  . Cancer ([place])   . Hypertension   . Renal disorder     Past Surgical History:  Procedure Laterality Date  . CARDIAC SURGERY    . CYSTOSCOPY    . LITHOTRIPSY     Family History: History reviewed. No pertinent family history. Family Psychiatric  History: Per admission H&P: Non-contributory Social History:  Social History   Substance and Sexual Activity  Alcohol Use Not Currently     Social History   Substance and Sexual Activity  Drug Use Yes    Social History   Socioeconomic History  . Marital status: Single    Spouse name: Not on file  . Number of children: Not on file  . Years of education: Not on file  . Highest education level: Not on file  Occupational History  . Not on file  Social Needs  . Financial resource strain: Not on file  . Food insecurity:    Worry: Not on file    Inability:  Not on file  . Transportation needs:    Medical: Not on file    Non-medical: Not on file  Tobacco Use  . Smoking status: Current Every Day Smoker    Types: E-cigarettes  . Smokeless tobacco: Former Systems developer    Types: [place] date: 05/12/2008  Substance and Sexual Activity  . Alcohol use: Not Currently  . Drug use: Yes  . Sexual activity: Not Currently  Lifestyle  . Physical activity:    Days per week: Not on file    Minutes per session: Not on file  . Stress: Not on file  Relationships  . Social connections:    Talks on phone: Not on file    Gets together: Not on file    Attends religious service: Not on file    Active member of club or organization: Not on file    Attends meetings of clubs or organizations: Not on file    Relationship status: Not on file  Other Topics Concern  . Not on file  Social History Narrative  . Not on file    Hospital Course:  From admission H&P 07/03/2018: Patient is a 53 year old male with a past psychiatric history significant for methamphetamine dependence since induced mood disorder, substance-induced psychotic disorder, depression and anxiety who presented to the [facility] emergency department on 07/01/2018 with suicidal ideation. The patient had been staying in a sober living house and had been sober since his discharge  from a [facility] hospital in August 2019. He had 2 colleagues at the sober living house decided to leave and get jobs. He interviewed for a job on Friday and was undergoing a background check, but he and his friends went out and started abusing substances that night. He stated that he had used methamphetamines, and his drug screen was also positive for opiates, but he denied any opiate use. He reported visual hallucinations as well as suicidal ideation in the emergency department. He also had some paranoid thoughts. He stated he was not sleeping, and only slept about 3 hours. He was very sad and despondent over  the fact that he had relapsed because he was "just getting back together with my family". He has a past medical history significant for congestive heart failure, coronary artery disease, hyperlipidemia and hypertension. He had been sober for the last 7 months. He had previously been on 600 mg of Seroquel at at bedtime. He was admitted to the hospital for evaluation and stabilization.  [name] was admitted for methamphetamine dependence, with depression and psychotic symptoms. He was started on Haldol, Zoloft and trazodone. He appeared guarded and was selectively mute at times during admission and later reported he was responding to auditory hallucinations. He improved with medications and therapy. He is discharging to [facility] residential rehab. He remained on the [facility] unit for 9 days. He was discharged on the medications listed below. He has shown improvement with improved mood, affect, sleep, appetite, and interaction. He denies any SI/HI/AVH and contracts for safety. Patient is provided with prescriptions for medications upon discharge. He is leaving with taxi voucher for [facility] residential rehab.  Physical Findings: AIMS: Facial and Oral Movements Muscles of Facial Expression: None, normal Lips and Perioral Area: None, normal Jaw: None, normal Tongue: None, normal,Extremity Movements Upper (arms, wrists, hands, fingers): None, normal Lower (legs, knees, ankles, toes): None, normal, Trunk Movements Neck, shoulders, hips: None, normal, Overall Severity Severity of abnormal movements (highest score from questions above): None, normal Incapacitation due to abnormal movements: None, normal Patient's awareness of abnormal movements (rate only patient's report): No Awareness, Dental Status Current problems with teeth and/or dentures?: No Does patient usually wear dentures?: No  CIWA:  CIWA-Ar Total: 5 COWS:     Musculoskeletal: Strength & Muscle Tone: within normal limits Gait & Station:  normal Patient leans: N/A  Psychiatric Specialty Exam: Physical Exam  Nursing note and vitals reviewed. Constitutional: He is oriented to person, place, and time. He appears well-developed and well-nourished.  Cardiovascular: Normal rate.  Respiratory: Effort normal.  Neurological: He is alert and oriented to person, place, and time.    Review of Systems  Constitutional: Negative.   Psychiatric/Behavioral: Positive for depression (improving), hallucinations (improved and stable on medication) and substance abuse (meth). Negative for memory loss and suicidal ideas. The patient is not nervous/anxious and does not have insomnia.     Blood pressure 130/72, pulse 80, temperature 97.9 F (36.6 C), temperature source Oral, resp. rate 16, height 5\' 4"  (1.626 m), weight 68 kg, SpO2 97 %.Body mass index is 25.75 kg/m.  See MD's discharge SRA     Have you used any form of tobacco in the last 30 days? (Cigarettes, Smokeless Tobacco, Cigars, and/or Pipes): Yes  Has this patient used any form of tobacco in the last 30 days? (Cigarettes, Smokeless Tobacco, Cigars, and/or Pipes) Yes, a prescription for an FDA-approved medication for tobacco cessation was offered at discharge.   Blood Alcohol level:  Lab Results  Component Value Date   ETH <10 75/01/2584    Metabolic Disorder Labs:  Lab Results  Component Value Date   HGBA1C 5.3 07/05/2018   MPG 105.41 07/05/2018   No results found for: PROLACTIN Lab Results  Component Value Date   CHOL 162 07/05/2018   TRIG 127 07/05/2018   HDL 35 (L) 07/05/2018   CHOLHDL 4.6 07/05/2018   VLDL 25 07/05/2018   LDLCALC 102 (H) 07/05/2018    See Psychiatric Specialty Exam and Suicide Risk Assessment completed by Attending Physician prior to discharge.  Discharge destination:  [facility]  Is patient on multiple antipsychotic therapies at discharge:  No   Has Patient had three or more failed trials of antipsychotic monotherapy by history:   No  Recommended Plan for Multiple Antipsychotic Therapies: NA  Discharge Instructions    Discharge instructions   Complete by:  As directed    Patient is instructed to take all prescribed medications as recommended. Report any side effects or adverse reactions to your outpatient psychiatrist. Patient is instructed to abstain from alcohol and illegal drugs while on prescription medications. In the event of worsening symptoms, patient is instructed to call the crisis hotline, 911, or go to the nearest emergency department for evaluation and treatment.     Allergies as of 07/11/2018      Reactions   Toradol [ketorolac Tromethamine] Shortness Of Breath   Sulfa Antibiotics Other (See Comments)   Caused Bells palsy       Medication List    STOP taking these medications   acetaminophen 325 MG tablet Commonly known as:  TYLENOL   ibuprofen 200 MG tablet Commonly known as:  ADVIL,MOTRIN   lisinopril 20 MG tablet Commonly known as:  PRINIVIL,ZESTRIL     TAKE these medications     Indication  albuterol 108 (90 Base) MCG/ACT inhaler Commonly known as:  PROVENTIL HFA;VENTOLIN HFA Inhale 2 puffs into the lungs every 6 (six) hours as needed for wheezing or shortness of breath.  Indication:  Asthma   amLODipine 10 MG tablet Commonly known as:  NORVASC Take 1 tablet (10 mg total) by mouth daily. For high blood pressure What changed:  additional instructions  Indication:  High Blood Pressure Disorder   atorvastatin 40 MG tablet Commonly known as:  LIPITOR Take 1 tablet (40 mg total) by mouth at bedtime. For high cholesterol What changed:    when to take this  additional instructions  Indication:  High Amount of Fats in the Blood   carvedilol 25 MG tablet Commonly known as:  COREG Take 1 tablet (25 mg total) by mouth daily. For high blood pressure Start taking on:  July 12, 2018 What changed:    when to take this  additional instructions  Indication:  High Blood  Pressure Disorder   cloNIDine 0.3 MG tablet Commonly known as:  CATAPRES Take 1 tablet (0.3 mg total) by mouth 3 (three) times daily. For high blood pressure What changed:  additional instructions  Indication:  High Blood Pressure Disorder   haloperidol 10 MG tablet Commonly known as:  HALDOL Take 1 tablet (10 mg total) by mouth at bedtime. For psychosis  Indication:  Psychosis   haloperidol 2 MG tablet Commonly known as:  HALDOL Take 1 tablet (2 mg total) by mouth every morning. For psychosis Start taking on:  July 12, 2018  Indication:  Psychosis   lactulose 10 GM/15ML solution Commonly known as:  CHRONULAC Take 45 mLs (30 g total) by mouth daily. For  high ammonia Start taking on:  July 12, 2018  Indication:  High ammonia   neomycin-bacitracin-polymyxin Oint Commonly known as:  NEOSPORIN Apply 1 application topically 3 (three) times daily. To right wrist  Indication:  Wound   nicotine 21 mg/24hr patch Commonly known as:  NICODERM CQ - dosed in mg/24 hours Place 1 patch (21 mg total) onto the skin daily. For smoking cessation Start taking on:  July 12, 2018  Indication:  Nicotine Addiction   sertraline 100 MG tablet Commonly known as:  ZOLOFT Take 1 tablet (100 mg total) by mouth daily. For mood Start taking on:  July 12, 2018  Indication:  Mood   tamsulosin 0.4 MG Caps capsule Commonly known as:  FLOMAX Take 1 capsule (0.4 mg total) by mouth daily. For BPH What changed:  additional instructions  Indication:  Benign Enlargement of Prostate   traZODone 100 MG tablet Commonly known as:  DESYREL Take 1 tablet (100 mg total) by mouth at bedtime as needed for sleep (May repeat x 1 after 1 hour).  Indication:  Trouble Sleeping      Follow-up Information    Services, [facility]. Go on 07/12/2018.   Why:  Please attend your residential treatment screening on 07/12/2018 at 7:45am. Bring your hospital discharge paperwork, photo ID, and a 28 day supply of  medications.  Contact information: [place] [address] [phone]           Follow-up recommendations: Activity as tolerated. Diet as recommended by primary care physician. Keep all scheduled follow-up appointments as recommended.   Comments:   Patient is instructed to take all prescribed medications as recommended. Report any side effects or adverse reactions to your outpatient psychiatrist. Patient is instructed to abstain from alcohol and illegal drugs while on prescription medications. In the event of worsening symptoms, patient is instructed to call the crisis hotline, 911, or go to the nearest emergency department for evaluation and treatment.  Signed: Connye Burkitt, NP 07/11/2018, 1:57 PM

## 2018-07-11 NOTE — Progress Notes (Signed)
Patient ID: Robert Shepherd, male   DOB: [birth year], 53 y.o.   MRN: [id number]  D: Patient jokingly saying that he is someone else on approach tonight. Reports his mood is better at this time. Doesn't appear to be preoccupied. Reporting auditory hallucinations on and off. Denies any SI or HI tonight. Having trouble falling asleep tonight so an order obtained for trazodone. A: Staff will monitor on q 15 minute checks, follow treatment plan, and give medications as ordered. R: Cooperative on the unit.

## 2018-07-11 NOTE — Progress Notes (Addendum)
Robert Shepherd attended wrap-up group tonight. Pt denies SI/HI/AVH/Pain at this time. Pt appears flat/depressed in affect and mood. Pt is guarded/miminal with interaction.Pt is inform of discharge tomorrow morning. Pt states he hopes to sleep better tonight.Trazodone was increase to 100 HS. Support provided. Will continue with POC.

## 2018-07-11 NOTE — [facility] Suicide Risk Assessment (Signed)
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   Blood pressure 130/72, pulse 80, temperature 97.9 F (36.6 C), temperature source Oral, resp. rate 16, height 5\' 4"  (1.626 m), weight 68 kg, SpO2 97 %.Body mass index is 25.75 kg/m.  General Appearance: Disheveled  Eye Sport and exercise psychologist::  Fair  Speech:  Normal Rate409  Volume:  Decreased  Mood:  Dysphoric  Affect:  Congruent  Thought Process:  Coherent and Descriptions of Associations: Intact  Orientation:  Full (Time, Place, and Person)  Thought Content:  Hallucinations: Auditory  Suicidal Thoughts:  No  Homicidal Thoughts:  No  Memory:  Immediate;   Fair Recent;   Fair Remote;   Fair  Judgement:  Intact  Insight:  Lacking  Psychomotor Activity:  Decreased  Concentration:  Fair  Recall:  [employer] of Knowledge:Fair  Language: Fair  Akathisia:  Negative  Handed:  Right  AIMS (if indicated):     Assets:  Desire for Improvement Resilience  Sleep:  Number of Hours: 4.5  Cognition: WNL  ADL's:  Intact   Mental Status Per Nursing Assessment::   On Admission:  Suicidal ideation indicated by patient, Self-harm thoughts  Demographic Factors:  Male, Divorced or widowed, Caucasian, Low socioeconomic status, Living alone and Unemployed  Loss Factors: Decline in physical health and Financial problems/change in socioeconomic status  Historical Factors: Impulsivity  Risk Reduction Factors:   NA  Continued Clinical Symptoms:  Bipolar  Disorder:   Depressive phase Alcohol/Substance Abuse/Dependencies  Cognitive Features That Contribute To Risk:  Closed-mindedness    Suicide Risk:  Minimal: No identifiable suicidal ideation.  Patients presenting with no risk factors but with morbid ruminations; may be classified as minimal risk based on the severity of the depressive symptoms  Follow-up Information    [facility], [facility]. Go on 07/12/2018.   Why:  Please attend your residential treatment screening on 07/12/2018 at 7:45am. Bring your hospital discharge paperwork, photo ID, and a 28 day supply of medications.  Contact information: [address] [place] [place] [address] [phone]           Plan Of Care/Follow-up recommendations:  Activity:  ad lib  Sharma Covert, MD 07/11/2018, 2:38 PM

## 2018-07-11 NOTE — Progress Notes (Signed)
[facility] MD Progress Note  07/11/2018 11:07 AM Robert Shepherd  MRN:  [id number] Subjective:  Patient is a 53 year old male who was admitted on 07/03/2018 with suicidal ideation.  He had recently relapsed on methamphetamines.  He also has a diagnosis of substance-induced mood disorder, substance-induced psychotic disorder.  He had been staying in a sober living house, but had relapsed.  Objective: Patient is seen and examined.  Patient is a 53 year old male with the above-stated past psychiatric history is seen in follow-up.  Actually stated this a.m. that the voices were better.  He did get out of bed last night, and was talking to nursing staff in a semi-appropriate manner.  He was supposed to go to the rehabilitation facility this a.m., but because of the lack of improvement we held off on that.  I discussed with him how he felt about going to the rehabilitation facility tomorrow, and he is in favor of that.  He also stated he felt as though his mood was doing a bit better.  His Haldol was increased to 2 mg p.o. daily and 10 mg p.o. nightly last night, and his Zoloft was increased to 100 mg p.o. daily.  He denied any suicidal ideation.  He stated he continues to have auditory hallucinations, but they have decreased.  The change in his blood pressure medications has been effective.  His blood pressures down to 130/80, and his pressures been stable since we went to the current combination.  He is afebrile.  Nursing notes reflect that he slept approximately 5 hours last night.  He is sleeping some during the day as well.  He denied any side effects to his current medications.  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 Cancer ([place])   . Hypertension   . Renal disorder     Past Surgical History:  Procedure Laterality Date  . CARDIAC SURGERY    . CYSTOSCOPY    . LITHOTRIPSY     Family History: History reviewed. No pertinent family history. Family Psychiatric  History: See admission H&P Social History:  Social History   Substance and Sexual Activity  Alcohol Use Not Currently     Social History   Substance and Sexual Activity  Drug Use Yes    Social History   Socioeconomic History  . Marital status: Single    Spouse name: Not on file  . Number of children: Not on file  . Years of education: Not on file  . Highest education level: Not on file  Occupational History  . Not on file  Social Needs  . Financial resource strain: Not on file  . Food insecurity:    Worry: Not on file    Inability: Not on file  . Transportation needs:    Medical: Not on file    Non-medical: Not on file  Tobacco Use  . Smoking status: Current Every Day Smoker    Types: E-cigarettes  . Smokeless tobacco: Former Systems developer    Types: [place] date: 05/12/2008  Substance and Sexual Activity  . Alcohol use: Not Currently  . Drug use: Yes  . Sexual activity: Not Currently  Lifestyle  . Physical activity:    Days  per week: Not on file    Minutes per session: Not on file  . Stress: Not on file  Relationships  . Social connections:    Talks on phone: Not on file    Gets together: Not on file    Attends religious service: Not on file    Active member of club or organization: Not on file    Attends meetings of clubs or organizations: Not on file    Relationship status: Not on file  Other Topics Concern  . Not on file  Social History Narrative  . Not on file   Additional Social History:                         Sleep: Fair  Appetite:  Fair  Current Medications: Current Facility-Administered Medications  Medication Dose  Route Frequency Provider Last Rate Last Dose  . acetaminophen (TYLENOL) tablet 650 mg  650 mg Oral Q6H PRN Patrecia Pour, NP   650 mg at 07/09/18 1718  . albuterol (PROVENTIL HFA;VENTOLIN HFA) 108 (90 Base) MCG/ACT inhaler 2 puff  2 puff Inhalation Q6H PRN Patrecia Pour, NP      . alum & mag hydroxide-simeth (MAALOX/MYLANTA) 200-200-20 MG/5ML suspension 30 mL  30 mL Oral Q4H PRN Patrecia Pour, NP      . amLODipine (NORVASC) tablet 10 mg  10 mg Oral Daily Patrecia Pour, NP   10 mg at 07/11/18 0805  . atorvastatin (LIPITOR) tablet 40 mg  40 mg Oral QHS Patrecia Pour, NP   40 mg at 07/10/18 2111  . carvedilol (COREG) tablet 25 mg  25 mg Oral Daily Patrecia Pour, NP   25 mg at 07/11/18 0805  . cloNIDine (CATAPRES) tablet 0.3 mg  0.3 mg Oral TID Sharma Covert, MD   0.3 mg at 07/11/18 0805  . haloperidol (HALDOL) tablet 10 mg  10 mg Oral QHS Sharma Covert, MD   10 mg at 07/10/18 2111  . haloperidol (HALDOL) tablet 2 mg  2 mg Oral BH-q7a Cobos, Myer Peer, MD   2 mg at 07/11/18 0805  . lactulose (CHRONULAC) 10 GM/15ML solution 10 g  10 g Oral TID Patriciaann Clan E, PA-C   10 g at 07/11/18 0804  . LORazepam (ATIVAN) tablet 0.5 mg  0.5 mg Oral Q6H PRN Cobos, Myer Peer, MD   0.5 mg at 07/10/18 2314  . magnesium hydroxide (MILK OF MAGNESIA) suspension 30 mL  30 mL Oral Daily PRN Patrecia Pour, NP      . neomycin-bacitracin-polymyxin (NEOSPORIN) ointment   Topical TID Sharma Covert, MD   Stopped at 07/11/18 [id number]  . nicotine (NICODERM CQ - dosed in mg/24 hours) patch 21 mg  21 mg Transdermal Daily Sharma Covert, MD   21 mg at 07/11/18 0805  . sertraline (ZOLOFT) tablet 100 mg  100 mg Oral Daily Sharma Covert, MD   100 mg at 07/11/18 0805  . tamsulosin (FLOMAX) capsule 0.4 mg  0.4 mg Oral Daily Patrecia Pour, NP   0.4 mg at 07/11/18 0805  . traZODone (DESYREL) tablet 50 mg  50 mg Oral QHS PRN Laverle Hobby, PA-C   50 mg at 07/11/18 0009    Lab Results: No results  found for this or any previous visit (from the past 87 hour(s)).  Blood Alcohol level:  Lab Results  Component Value Date   ETH <10 51/76/1607    Metabolic  Disorder Labs: Lab Results  Component Value Date   HGBA1C 5.3 07/05/2018   MPG 105.41 07/05/2018   No results found for: PROLACTIN Lab Results  Component Value Date   CHOL 162 07/05/2018   TRIG 127 07/05/2018   HDL 35 (L) 07/05/2018   CHOLHDL 4.6 07/05/2018   VLDL 25 07/05/2018   LDLCALC 102 (H) 07/05/2018    Physical Findings: AIMS: Facial and Oral Movements Muscles of Facial Expression: None, normal Lips and Perioral Area: None, normal Jaw: None, normal Tongue: None, normal,Extremity Movements Upper (arms, wrists, hands, fingers): None, normal Lower (legs, knees, ankles, toes): None, normal, Trunk Movements Neck, shoulders, hips: None, normal, Overall Severity Severity of abnormal movements (highest score from questions above): None, normal Incapacitation due to abnormal movements: None, normal Patient's awareness of abnormal movements (rate only patient's report): No Awareness, Dental Status Current problems with teeth and/or dentures?: No Does patient usually wear dentures?: No  CIWA:  CIWA-Ar Total: 5 COWS:     Musculoskeletal: Strength & Muscle Tone: within normal limits Gait & Station: normal Patient leans: N/A  Psychiatric Specialty Exam: Physical Exam  Nursing note and vitals reviewed. Constitutional: He is oriented to person, place, and time. He appears well-developed and well-nourished.  HENT:  Head: Normocephalic and atraumatic.  Respiratory: Effort normal.  Neurological: He is alert and oriented to person, place, and time.    ROS  Blood pressure 130/80, pulse 90, temperature 97.9 F (36.6 C), temperature source Oral, resp. rate 16, height 5\' 4"  (1.626 m), weight 68 kg, SpO2 97 %.Body mass index is 25.75 kg/m.  General Appearance: Casual  Eye Contact:  Fair  Speech:  Normal Rate  Volume:   Decreased  Mood:  Dysphoric  Affect:  Congruent  Thought Process:  Coherent and Descriptions of Associations: Intact  Orientation:  Full (Time, Place, and Person)  Thought Content:  Hallucinations: Auditory  Suicidal Thoughts:  No  Homicidal Thoughts:  No  Memory:  Immediate;   Fair Recent;   Fair Remote;   Fair  Judgement:  Intact  Insight:  Fair  Psychomotor Activity:  Psychomotor Retardation  Concentration:  Concentration: Fair and Attention Span: Fair  Recall:  [employer] of Knowledge:  Fair  Language:  Fair  Akathisia:  Negative  Handed:  Right  AIMS (if indicated):     Assets:  Desire for Improvement Resilience  ADL's:  Intact  Cognition:  WNL  Sleep:  Number of Hours: 4.5     Treatment Plan Summary: Daily contact with patient to assess and evaluate symptoms and progress in treatment, Medication management and Plan : Patient is seen and examined.  Patient is a 53 year old male with the above-stated past psychiatric history who is seen in follow-up. Diagnosis: #1 substance-induced mood disorder, #2 substance-induced psychotic disorder, #3 amphetamine dependence, #4 malignant hypertension, #5 chronic renal disease stage III-IV, #6 chronic liver disease, #7 increased ammonia, #8 hyperlipidemia, #9 coronary artery disease, #10 COPD, #11 BPH.  Patient appears to be doing significantly better today.  He got a little bit better sleep with increased Haldol dose, and his auditory hallucinations have decreased.  He denied suicidal ideation this morning.  His blood pressure is improved.  The only change I am going to make tinnitus to increase his trazodone 200 mg p.o. nightly.  We will get an EKG today given all of his blood pressure medications.  As long as today goes well we will plan on getting him to the rehabilitation facility tomorrow.  Hopefully he will  continue to improve. 1.  Continue amlodipine 10 mg p.o. daily, carvedilol 25 mg p.o. daily, clonidine 0.3 mg p.o. 3 times daily  for hypertension. 2.  Continue Lipitor 40 mg p.o. nightly for hyperlipidemia. 3.  Continue haloperidol 2 mg p.o. every morning and 10 mg p.o. nightly for psychosis. 4.  Continue lactulose but consolidate this to 30 mg p.o. nightly for increased ammonia. 5.  Continue Neosporin for peripheral wounds to the right anterior wrist. 6.  Continue sertraline 100 mg p.o. daily for mood and anxiety. 7.  Continue Flomax for benign prostatic hypertrophy. 8.  Increase trazodone 200 mg p.o. nightly as needed insomnia. 9.  Get an EKG this morning to assess rhythm 10.  Disposition planning-hopefully will be able to go to a rehab facility tomorrow.  Sharma Covert, MD 07/11/2018, 11:07 AM

## 2018-07-12 NOTE — Progress Notes (Addendum)
Discharge- Patient verbalizes readiness for discharge: Denies SI/HI, is not psychotic or delusional. A- Discharge instruction read and discussed with Elaine. All belongings returned to patient. Meal tray given.  R- Ptt verbalize understanding of discharge instructions.  Signed for return of belongings. Taxi called. Escorted to the lobby and waiting arrival of taxi.

## 2018-07-12 NOTE — Progress Notes (Signed)
  [facility] Adult Case Management Discharge Plan :  Will you be returning to the same living situation after discharge:  No. Patient discharged to [facility] in [place], [place] for residential treatment services.  At discharge, do you have transportation home?: Yes,  transported to [facility] via taxi voucher Do you have the ability to pay for your medications: No.  Release of information consent forms completed and in the chart;  Patient's signature needed at discharge.  Patient to Follow up at: Follow-up Information    Services, [facility]. Go on 07/12/2018.   Why:  Please attend your residential treatment screening on 07/12/2018 at 7:45am. Bring your hospital discharge paperwork, photo ID, and a 28 day supply of medications.  Contact information: [address] [place] [address] [phone]           Next level of care provider has access to [place] and Suicide Prevention discussed: Yes,  with the patient   Have you used any form of tobacco in the last 30 days? (Cigarettes, Smokeless Tobacco, Cigars, and/or Pipes): Yes  Has patient been referred to the Quitline?: Patient refused referral  Patient has been referred for addiction treatment: Yes  Marylee Floras, [place] 07/12/2018, 8:29 AM

## 2018-07-12 NOTE — Progress Notes (Signed)
Adult Psychoeducational Group Note  Date:  07/12/2018 Time:  4:09 AM  Group Topic/Focus:  Wrap-Up Group:   The focus of this group is to help patients review their daily goal of treatment and discuss progress on daily workbooks.  Participation Level:  Active  Participation Quality:  Appropriate  Affect:  Appropriate  Cognitive:  Appropriate  Insight: Appropriate and Good  Engagement in Group:  Engaged  Modes of Intervention:  Discussion  Additional Comments:   Pt said his day was a 2. The one positive thing that happened today he woke up.  Lenice Llamas Long 07/12/2018, 4:09 AM

## 2018-07-16 ENCOUNTER — Emergency Department (HOSPITAL_COMMUNITY)
Admission: EM | Admit: 2018-07-16 | Discharge: 2018-07-16 | Disposition: A | Discharge status: Home / Self Care | Payer: Self-pay | Attending: Emergency Medicine | Admitting: Emergency Medicine

## 2018-07-16 ENCOUNTER — Emergency Department (HOSPITAL_COMMUNITY): Payer: Self-pay

## 2018-07-16 ENCOUNTER — Encounter (HOSPITAL_COMMUNITY): Payer: Self-pay | Admitting: Emergency Medicine

## 2018-07-16 ENCOUNTER — Other Ambulatory Visit: Payer: Self-pay

## 2018-07-16 DIAGNOSIS — R05 Cough: Secondary | ICD-10-CM

## 2018-07-16 DIAGNOSIS — I10 Essential (primary) hypertension: Secondary | ICD-10-CM | POA: Insufficient documentation

## 2018-07-16 DIAGNOSIS — R0602 Shortness of breath: Secondary | ICD-10-CM

## 2018-07-16 DIAGNOSIS — Z79899 Other long term (current) drug therapy: Secondary | ICD-10-CM | POA: Insufficient documentation

## 2018-07-16 DIAGNOSIS — Z859 Personal history of malignant neoplasm, unspecified: Secondary | ICD-10-CM | POA: Insufficient documentation

## 2018-07-16 DIAGNOSIS — R197 Diarrhea, unspecified: Secondary | ICD-10-CM | POA: Insufficient documentation

## 2018-07-16 DIAGNOSIS — R059 Cough, unspecified: Secondary | ICD-10-CM

## 2018-07-16 DIAGNOSIS — J069 Acute upper respiratory infection, unspecified: Secondary | ICD-10-CM | POA: Insufficient documentation

## 2018-07-16 DIAGNOSIS — F329 Major depressive disorder, single episode, unspecified: Secondary | ICD-10-CM | POA: Insufficient documentation

## 2018-07-16 DIAGNOSIS — F1520 Other stimulant dependence, uncomplicated: Secondary | ICD-10-CM | POA: Insufficient documentation

## 2018-07-16 DIAGNOSIS — R195 Other fecal abnormalities: Secondary | ICD-10-CM

## 2018-07-16 DIAGNOSIS — F17290 Nicotine dependence, other tobacco product, uncomplicated: Secondary | ICD-10-CM | POA: Insufficient documentation

## 2018-07-16 LAB — CBC WITH DIFFERENTIAL/PLATELET
Abs Immature Granulocytes: 0.04 10*3/uL (ref 0.00–0.07)
BASOS PCT: 1 %
Basophils Absolute: 0.1 10*3/uL (ref 0.0–0.1)
Eosinophils Absolute: 0.2 10*3/uL (ref 0.0–0.5)
Eosinophils Relative: 2 %
HCT: 41.9 % (ref 39.0–52.0)
Hemoglobin: 14.3 g/dL (ref 13.0–17.0)
Immature Granulocytes: 0 %
Lymphocytes Relative: 13 %
Lymphs Abs: 1.5 10*3/uL (ref 0.7–4.0)
MCH: 29.5 pg (ref 26.0–34.0)
MCHC: 34.1 g/dL (ref 30.0–36.0)
MCV: 86.4 fL (ref 80.0–100.0)
Monocytes Absolute: 1 10*3/uL (ref 0.1–1.0)
Monocytes Relative: 8 %
Neutro Abs: 9 10*3/uL — ABNORMAL HIGH (ref 1.7–7.7)
Neutrophils Relative %: 76 %
PLATELETS: 355 10*3/uL (ref 150–400)
RBC: 4.85 MIL/uL (ref 4.22–5.81)
RDW: 12.7 % (ref 11.5–15.5)
WBC: 11.7 10*3/uL — ABNORMAL HIGH (ref 4.0–10.5)
nRBC: 0 % (ref 0.0–0.2)

## 2018-07-16 LAB — URINALYSIS, ROUTINE W REFLEX MICROSCOPIC
Bilirubin Urine: NEGATIVE
Glucose, UA: NEGATIVE mg/dL
Hgb urine dipstick: NEGATIVE
Ketones, ur: NEGATIVE mg/dL
Leukocytes,Ua: NEGATIVE
Nitrite: NEGATIVE
Protein, ur: NEGATIVE mg/dL
SPECIFIC GRAVITY, URINE: 1.016 (ref 1.005–1.030)
pH: 5 (ref 5.0–8.0)

## 2018-07-16 LAB — COMPREHENSIVE METABOLIC PANEL
ALT: 22 U/L (ref 0–44)
AST: 19 U/L (ref 15–41)
Albumin: 4.5 g/dL (ref 3.5–5.0)
Alkaline Phosphatase: 61 U/L (ref 38–126)
Anion gap: 9 (ref 5–15)
BUN: 20 mg/dL (ref 6–20)
CO2: 22 mmol/L (ref 22–32)
Calcium: 9.5 mg/dL (ref 8.9–10.3)
Chloride: 110 mmol/L (ref 98–111)
Creatinine, Ser: 1.41 mg/dL — ABNORMAL HIGH (ref 0.61–1.24)
GFR calc Af Amer: >60 mL/min (ref >60)
GFR calc non Af Amer: 57 mL/min — ABNORMAL LOW (ref >60)
Glucose, Bld: 97 mg/dL (ref 70–99)
Potassium: 3.8 mmol/L (ref 3.5–5.1)
SODIUM: 141 mmol/L (ref 135–145)
TOTAL PROTEIN: 7.7 g/dL (ref 6.5–8.1)
Total Bilirubin: 0.5 mg/dL (ref 0.3–1.2)

## 2018-07-16 LAB — TROPONIN I: Troponin I: <0.03 ng/mL (ref <0.03)

## 2018-07-16 LAB — I-STAT TROPONIN, ED: Troponin i, poc: 0 ng/mL (ref 0.00–0.08)

## 2018-07-16 LAB — D-DIMER, QUANTITATIVE: D-Dimer, Quant: 0.31 ug/mL-FEU (ref 0.00–0.50)

## 2018-07-16 LAB — LIPASE, BLOOD: Lipase: 31 U/L (ref 11–51)

## 2018-07-16 MED ORDER — ALBUTEROL SULFATE (2.5 MG/3ML) 0.083% IN NEBU: 5.0000 mg | INHALATION_SOLUTION | Freq: Once | RESPIRATORY_TRACT | Status: DC

## 2018-07-16 NOTE — ED Provider Notes (Signed)
[facility] DEPT Provider Note   CSN: [id number] Arrival date & time: 07/16/18  2005    History   Chief Complaint Chief Complaint  Patient presents with  . Shortness of Breath    HPI Dywane Caudillo is a 53 y.o. male.     The history is provided by the patient and medical records. No language interpreter was used.  Cough  Cough characteristics:  Productive Sputum characteristics:  Clear Severity:  Moderate Onset quality:  Gradual Duration:  5 days Timing:  Constant Progression:  Waxing and waning Chronicity:  New Context: sick contacts   Relieved by:  Nothing Worsened by:  Nothing Ineffective treatments:  None tried Associated symptoms: chills, shortness of breath and sinus congestion   Associated symptoms: no chest pain, no diaphoresis, no fever, no headaches, no myalgias, no rash, no rhinorrhea and no wheezing     Past Medical History:  Diagnosis Date  . Cancer ([place])   . Hypertension   . Renal disorder     Patient Active Problem List   Diagnosis Date Noted  . 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 Hypertensive urgency 05/12/2018    Past Surgical History:  Procedure Laterality Date  . CARDIAC SURGERY    . CYSTOSCOPY    . LITHOTRIPSY          Home Medications    Prior to Admission medications   Medication Sig Start Date End Date Taking? Authorizing Provider  albuterol (PROVENTIL HFA;VENTOLIN HFA) 108 (90 Base) MCG/ACT inhaler Inhale 2 puffs into the lungs every 6 (six) hours as needed for wheezing or shortness of breath. 07/11/18   Connye Burkitt, NP  amLODipine (NORVASC) 10 MG tablet Take 1 tablet (10 mg total) by mouth daily. For high blood pressure 07/11/18   Connye Burkitt, NP   atorvastatin (LIPITOR) 40 MG tablet Take 1 tablet (40 mg total) by mouth at bedtime. For high cholesterol 07/11/18   Connye Burkitt, NP  carvedilol (COREG) 25 MG tablet Take 1 tablet (25 mg total) by mouth daily. For high blood pressure 07/12/18   Connye Burkitt, NP  cloNIDine (CATAPRES) 0.3 MG tablet Take 1 tablet (0.3 mg total) by mouth 3 (three) times daily. For high blood pressure 07/11/18   Connye Burkitt, NP  haloperidol (HALDOL) 10 MG tablet Take 1 tablet (10 mg total) by mouth at bedtime. For psychosis 07/11/18   Connye Burkitt, NP  haloperidol (HALDOL) 2 MG tablet Take 1 tablet (2 mg total) by mouth every morning. For psychosis 07/12/18   Connye Burkitt, NP  lactulose (CHRONULAC) 10 GM/15ML solution Take 45 mLs (30 g total) by mouth daily. For high ammonia 07/12/18   Connye Burkitt, NP  neomycin-bacitracin-polymyxin (NEOSPORIN) OINT Apply 1 application topically 3 (three) times daily. To right wrist 07/11/18   Connye Burkitt, NP  nicotine (NICODERM CQ - DOSED IN MG/24 HOURS) 21 mg/24hr patch Place 1 patch (21 mg total) onto the skin daily. For smoking cessation 07/12/18   Connye Burkitt, NP  sertraline (ZOLOFT) 100 MG tablet Take 1 tablet (100 mg total) by mouth daily. For mood 07/12/18   Connye Burkitt, NP  tamsulosin (FLOMAX) 0.4 MG CAPS capsule Take 1 capsule (0.4 mg total) by mouth daily. For BPH 07/11/18  Connye Burkitt, NP  traZODone (DESYREL) 100 MG tablet Take 1 tablet (100 mg total) by mouth at bedtime as needed for sleep (May repeat x 1 after 1 hour). 07/11/18   Connye Burkitt, NP    Family History No family history on file.  Social History Social History   Tobacco Use  . Smoking status: Current Every Day Smoker    Types: E-cigarettes  . Smokeless tobacco: Former Systems developer    Types: Chew    Quit date: 05/12/2008  Substance Use Topics  . Alcohol use: Not Currently  . Drug use: Yes     Allergies   Toradol [ketorolac tromethamine] and Sulfa antibiotics   Review of Systems Review  of Systems  Constitutional: Positive for chills. Negative for diaphoresis, fatigue and fever.  HENT: Positive for congestion. Negative for rhinorrhea.   Eyes: Negative for visual disturbance.  Respiratory: Positive for cough, chest tightness and shortness of breath. Negative for wheezing and stridor.   Cardiovascular: Negative for chest pain, palpitations and leg swelling.  Gastrointestinal: Positive for abdominal pain and diarrhea. Negative for constipation, nausea and vomiting.  Genitourinary: Negative for flank pain.  Musculoskeletal: Negative for back pain, myalgias, neck pain and neck stiffness.  Skin: Negative for rash and wound.  Neurological: Negative for dizziness, light-headedness and headaches.  Psychiatric/Behavioral: Negative for agitation.  All other systems reviewed and are negative.    Physical Exam Updated Vital Signs BP (!) 189/148 (BP Location: Left Arm)   Pulse (!) 125   Temp 98.5 F (36.9 C) (Oral)   Resp 16   SpO2 97%   Physical Exam Vitals signs and nursing note reviewed.  Constitutional:      General: He is not in acute distress.    Appearance: He is well-developed. He is not ill-appearing, toxic-appearing or diaphoretic.  HENT:     Head: Normocephalic and atraumatic.     Mouth/Throat:     Pharynx: No pharyngeal swelling or oropharyngeal exudate.  Eyes:     Conjunctiva/sclera: Conjunctivae normal.     Pupils: Pupils are equal, round, and reactive to light.  Neck:     Musculoskeletal: Neck supple.  Cardiovascular:     Rate and Rhythm: Regular rhythm. Tachycardia present.     Heart sounds: No murmur.  Pulmonary:     Effort: Pulmonary effort is normal. No respiratory distress.     Breath sounds: Normal breath sounds. No decreased breath sounds, wheezing, rhonchi or rales.  Chest:     Chest wall: No tenderness.  Abdominal:     Palpations: Abdomen is soft.     Tenderness: There is no abdominal tenderness.  Musculoskeletal:     Right lower leg: He  exhibits no tenderness. No edema.     Left lower leg: He exhibits no tenderness. No edema.  Skin:    General: Skin is warm and dry.     Capillary Refill: Capillary refill takes less than 2 seconds.  Neurological:     General: No focal deficit present.     Mental Status: He is alert.  Psychiatric:        Mood and Affect: Mood normal.      ED Treatments / Results  Labs (all labs ordered are listed, but only abnormal results are displayed) Labs Reviewed  CBC WITH DIFFERENTIAL/PLATELET - Abnormal; Notable for the following components:      Result Value   WBC 11.7 (*)    Neutro Abs 9.0 (*)    All other components within normal limits  COMPREHENSIVE METABOLIC PANEL - Abnormal; Notable for the following components:   Creatinine, Ser 1.41 (*)    GFR calc non Af Amer 57 (*)    All other components within normal limits  URINE CULTURE  LIPASE, BLOOD  TROPONIN I  URINALYSIS, ROUTINE W REFLEX MICROSCOPIC  D-DIMER, QUANTITATIVE (NOT AT [facility])  I-STAT TROPONIN, ED    EKG EKG Interpretation  Date/Time:  Monday July 16 2018 22:50:40 EDT Ventricular Rate:  99 PR Interval:    QRS Duration: 101 QT Interval:  327 QTC Calculation: 420 R Axis:   33 Text Interpretation:  Sinus rhythm Abnormal T, consider ischemia, diffuse leads When compared to prior,  similar tachycardia and t wave inversions.  No STEMI Confirmed by Antony Blackbird [phone]) on 07/16/2018 10:55:20 PM   Radiology Dg Chest Portable 1 View  Result Date: 07/16/2018 CLINICAL DATA:  53 year old male with shortness of breath and cough. EXAM: PORTABLE CHEST 1 VIEW COMPARISON:  Chest radiograph dated 07/02/2018 FINDINGS: The lungs are clear. There is no pleural effusion pneumothorax. Top-normal cardiac size. Median sternotomy wires and CABG vascular clips and atrial appendage occlusive device. No acute osseous pathology. IMPRESSION: No active disease. Electronically Signed   By: Anner Crete M.D.   On: 07/16/2018 21:39     Procedures Procedures (including critical care time)  Medications Ordered in ED Medications - No data to display   Initial Impression / Assessment and Plan / ED Course  I have reviewed the triage vital signs and the nursing notes.  Pertinent labs & imaging results that were available during my care of the patient were reviewed by me and considered in my medical decision making (see chart for details).        Darden Flemister is a 53 y.o. male with a past medical history significant for renal cancer status post nephrectomy, hypertension, CAD status post CABG, and prior methamphetamine induced psychosis who presents for 5 days of cough, shortness of breath, chills, and diarrhea.  He reports that he has been around someone who recently traveled to [place] and had URI symptoms.  Patient is concerned he may have the novel coronavirus.  His work sent him here for evaluation.  He reports no chest pain and says this does not feel anything like the shortness of breath he had with prior CABG.  He reports no significant nausea or vomiting.  No urinary symptoms.  No recent trauma.  He reports some abdominal cramping in the setting of the loose stools and diarrhea.  No blood in his stool by report.  No history of DVT or PE.  He denies any pleuritic symptoms and no exertional shortness of breath.  Patient has no other complaints otherwise.  On exam, lungs are clear and chest is nontender.  Abdomen is nontender.  Active bowel sounds.  No flank tenderness or CVA tenderness.  Legs are nonedematous and nontender.  Patient is slightly tachycardic on arrival but is afebrile.  Based on patient symptoms I am concerned patient may have a viral URI causing his chills, diarrhea, cough, and shortness of breath.  Due to his history of CABG and his report that he did not have severe chest pain with this but rather had shortness of breath, patient will have a troponin and x-ray and labs.  Patient will have a d-dimer given his  age and the shortness of breath and chest tightness.  He also reports he has had a family history of blood clots.  Has discussed with patient, will do a  single troponin as his shortness of having gone on for 5 days and did not start within the last several hours.  If work-up is reassuring, anticipate discharge with plans to self quarantine due to the possible exposure to a patient with novel coronavirus.  Anticipate reassessment.  Patient's work-up was overall reassuring.  Creatinine improved from prior.  Mild leukocytosis likely consistent with viral infection.  Troponin and d-dimer negative.  Chest x-ray reassuring.  Patient's heart rate improved during ED stay.  Patient is feeling much better.  Patient likely has viral URI causing his symptoms based on his exposures.  We discussed possibility of novel coronavirus however, given his stability for discharge, the current policy is that he does not get tested in the emergency department.  Patient agrees with this plan and will self quarantine for the next 14 days and follow-up with PCP.  Patient understands extremely strict return precautions for new or worsening symptoms.  Patient other questions or concerns and was discharged in good condition.   Final Clinical Impressions(s) / ED Diagnoses   Final diagnoses:  Upper respiratory tract infection, unspecified type  Cough  Shortness of breath  Loose stools    ED Discharge Orders    None      Clinical Impression: 1. Upper respiratory tract infection, unspecified type   2. Cough   3. Shortness of breath   4. Loose stools     Disposition: Discharge  Condition: Good  I have discussed the results, Dx and Tx plan with the pt(& family if present). He/she/they expressed understanding and agree(s) with the plan. Discharge instructions discussed at great length. Strict return precautions discussed and pt &/or family have verbalized understanding of the instructions. No further questions at  time of discharge.    New Prescriptions   No medications on file    Follow Up: [facility] [address] [place] [address] [phone] Schedule an appointment as soon as possible for a visit    [facility] DEPT [place] [id number] mc [place]       Marquesha Robideau, Gwenyth Allegra, MD 07/16/18 2337

## 2018-07-16 NOTE — Discharge Instructions (Addendum)
Your history and exam today are consistent with a viral upper restaurant infection causing your cough, chills, and shortness breath.  Fortunately, your work-up was reassuring and we did not see evidence of pneumonia, heart injury, or blood clot causing her symptoms.  Based on current protocols, patients that do not need to be admitted will not be getting the coronavirus test at this time.  Given your contact with someone who recently traveled, the novel coronavirus is something to consider which is why we are recommending 14 days of self quarantine and isolation.  Please see the attached information for recommendations for this.  If any symptoms change or worsen, please return to the nearest emergency department.  Please stay hydrated and follow-up with your primary doctor.      Person Under Monitoring Name: Robert Shepherd  Location: [place] [address]   Infection Prevention Recommendations for Individuals Confirmed to have, or Being Evaluated for, 2019 Novel Coronavirus (COVID-19) Infection Who Receive Care at Home  Individuals who are confirmed to have, or are being evaluated for, COVID-19 should follow the prevention steps below until a healthcare provider or local or state health department says they can return to normal activities.  Stay home except to get medical care You should restrict activities outside your home, except for getting medical care. Do not go to work, school, or public areas, and do not use public transportation or taxis.  Call ahead before visiting your doctor Before your medical appointment, call the healthcare provider and tell them that you have, or are being evaluated for, COVID-19 infection. This will help the healthcare providers office take steps to keep other people from getting infected. Ask your healthcare provider to call the local or state health department.  Monitor your symptoms Seek prompt medical attention if your illness is  worsening (e.g., difficulty breathing). Before going to your medical appointment, call the healthcare provider and tell them that you have, or are being evaluated for, COVID-19 infection. Ask your healthcare provider to call the local or state health department.  Wear a facemask You should wear a facemask that covers your nose and mouth when you are in the same room with other people and when you visit a healthcare provider. People who live with or visit you should also wear a facemask while they are in the same room with you.  Separate yourself from other people in your home As much as possible, you should stay in a different room from other people in your home. Also, you should use a separate bathroom, if available.  Avoid sharing household items You should not share dishes, drinking glasses, cups, eating utensils, towels, bedding, or other items with other people in your home. After using these items, you should wash them thoroughly with soap and water.  Cover your coughs and sneezes Cover your mouth and nose with a tissue when you cough or sneeze, or you can cough or sneeze into your sleeve. Throw used tissues in a lined trash can, and immediately wash your hands with soap and water for at least 20 seconds or use an alcohol-based hand rub.  Wash your [facility] your hands often and thoroughly with soap and water for at least 20 seconds. You can use an alcohol-based hand sanitizer if soap and water are not available and if your hands are not visibly dirty. Avoid touching your eyes, nose, and mouth with unwashed hands.   Prevention Steps for Caregivers and Household Members of Individuals Confirmed  to have, or Being Evaluated for, COVID-19 Infection Being Cared for in the Home  If you live with, or provide care at home for, a person confirmed to have, or being evaluated for, COVID-19 infection please follow these guidelines to prevent infection:  Follow healthcare providers  instructions Make sure that you understand and can help the patient follow any healthcare provider instructions for all care.  Provide for the patients basic needs You should help the patient with basic needs in the home and provide support for getting groceries, prescriptions, and other personal needs.  Monitor the patients symptoms If they are getting sicker, call his or her medical provider and tell them that the patient has, or is being evaluated for, COVID-19 infection. This will help the healthcare providers office take steps to keep other people from getting infected. Ask the healthcare provider to call the local or state health department.  Limit the number of people who have contact with the patient If possible, have only one caregiver for the patient. Other household members should stay in another home or place of residence. If this is not possible, they should stay in another room, or be separated from the patient as much as possible. Use a separate bathroom, if available. Restrict visitors who do not have an essential need to be in the home.  Keep older adults, very young children, and other sick people away from the patient Keep older adults, very young children, and those who have compromised immune systems or chronic health conditions away from the patient. This includes people with chronic heart, lung, or kidney conditions, diabetes, and cancer.  Ensure good ventilation Make sure that shared spaces in the home have good air flow, such as from an air conditioner or an opened window, weather permitting.  Wash your hands often Wash your hands often and thoroughly with soap and water for at least 20 seconds. You can use an alcohol based hand sanitizer if soap and water are not available and if your hands are not visibly dirty. Avoid touching your eyes, nose, and mouth with unwashed hands. Use disposable paper towels to dry your hands. If not available, use dedicated cloth  towels and replace them when they become wet.  Wear a facemask and gloves Wear a disposable facemask at all times in the room and gloves when you touch or have contact with the patients blood, body fluids, and/or secretions or excretions, such as sweat, saliva, sputum, nasal mucus, vomit, urine, or feces.  Ensure the mask fits over your nose and mouth tightly, and do not touch it during use. Throw out disposable facemasks and gloves after using them. Do not reuse. Wash your hands immediately after removing your facemask and gloves. If your personal clothing becomes contaminated, carefully remove clothing and launder. Wash your hands after handling contaminated clothing. Place all used disposable facemasks, gloves, and other waste in a lined container before disposing them with other household waste. Remove gloves and wash your hands immediately after handling these items.  Do not share dishes, glasses, or other household items with the patient Avoid sharing household items. You should not share dishes, drinking glasses, cups, eating utensils, towels, bedding, or other items with a patient who is confirmed to have, or being evaluated for, COVID-19 infection. After the person uses these items, you should wash them thoroughly with soap and water.  Wash laundry thoroughly Immediately remove and wash clothes or bedding that have blood, body fluids, and/or secretions or excretions, such as sweat, saliva,  sputum, nasal mucus, vomit, urine, or feces, on them. Wear gloves when handling laundry from the patient. Read and follow directions on labels of laundry or clothing items and detergent. In general, wash and dry with the warmest temperatures recommended on the label.  Clean all areas the individual has used often Clean all touchable surfaces, such as counters, tabletops, doorknobs, bathroom fixtures, toilets, phones, keyboards, tablets, and bedside tables, every day. Also, clean any surfaces that may  have blood, body fluids, and/or secretions or excretions on them. Wear gloves when cleaning surfaces the patient has come in contact with. Use a diluted bleach solution (e.g., dilute bleach with 1 part bleach and 10 parts water) or a household disinfectant with a label that says EPA-registered for coronaviruses. To make a bleach solution at home, add 1 tablespoon of bleach to 1 quart (4 cups) of water. For a larger supply, add  cup of bleach to 1 gallon (16 cups) of water. Read labels of cleaning products and follow recommendations provided on product labels. Labels contain instructions for safe and effective use of the cleaning product including precautions you should take when applying the product, such as wearing gloves or eye protection and making sure you have good ventilation during use of the product. Remove gloves and wash hands immediately after cleaning.  Monitor yourself for signs and symptoms of illness Caregivers and household members are considered close contacts, should monitor their health, and will be asked to limit movement outside of the home to the extent possible. Follow the monitoring steps for close contacts listed on the symptom monitoring form.   ? If you have additional questions, contact your local health department or call the epidemiologist on call at [phone] (available 24/7). ? This guidance is subject to change. For the most up-to-date guidance from [facility], please refer to their website: YouBlogs.pl

## 2018-07-16 NOTE — ED Triage Notes (Addendum)
Patient c/o cough x4 days with SOB today. Reports diarrhea x2 days. Denies N/V, fevers, and chest pains. Patient tachycardic and hypertensive in triage.

## 2018-07-18 ENCOUNTER — Other Ambulatory Visit: Payer: Self-pay

## 2018-07-18 ENCOUNTER — Emergency Department (HOSPITAL_COMMUNITY): Payer: HRSA Program

## 2018-07-18 ENCOUNTER — Emergency Department (HOSPITAL_COMMUNITY)
Admission: EM | Admit: 2018-07-18 | Discharge: 2018-07-18 | Disposition: A | Discharge status: Home / Self Care | Payer: HRSA Program | Attending: Emergency Medicine | Admitting: Emergency Medicine

## 2018-07-18 DIAGNOSIS — F17290 Nicotine dependence, other tobacco product, uncomplicated: Secondary | ICD-10-CM | POA: Diagnosis not present

## 2018-07-18 DIAGNOSIS — I10 Essential (primary) hypertension: Secondary | ICD-10-CM | POA: Diagnosis not present

## 2018-07-18 DIAGNOSIS — R0789 Other chest pain: Secondary | ICD-10-CM

## 2018-07-18 DIAGNOSIS — Z79899 Other long term (current) drug therapy: Secondary | ICD-10-CM | POA: Insufficient documentation

## 2018-07-18 DIAGNOSIS — Z85528 Personal history of other malignant neoplasm of kidney: Secondary | ICD-10-CM | POA: Insufficient documentation

## 2018-07-18 DIAGNOSIS — Z20828 Contact with and (suspected) exposure to other viral communicable diseases: Secondary | ICD-10-CM | POA: Insufficient documentation

## 2018-07-18 DIAGNOSIS — R079 Chest pain, unspecified: Secondary | ICD-10-CM | POA: Diagnosis present

## 2018-07-18 LAB — POCT I-STAT EG7
Acid-base deficit: 2 mmol/L (ref 0.0–2.0)
Bicarbonate: 23.3 mmol/L (ref 20.0–28.0)
Calcium, Ion: 1.28 mmol/L (ref 1.15–1.40)
HCT: 35 % — ABNORMAL LOW (ref 39.0–52.0)
Hemoglobin: 11.9 g/dL — ABNORMAL LOW (ref 13.0–17.0)
O2 Saturation: 89 %
Patient temperature: 97.5
Potassium: 4.2 mmol/L (ref 3.5–5.1)
Sodium: 140 mmol/L (ref 135–145)
TCO2: 25 mmol/L (ref 22–32)
pCO2, Ven: 41.4 mmHg — ABNORMAL LOW (ref 44.0–60.0)
pH, Ven: 7.355 (ref 7.250–7.430)
pO2, Ven: 57 mmHg — ABNORMAL HIGH (ref 32.0–45.0)

## 2018-07-18 LAB — CBC WITH DIFFERENTIAL/PLATELET
Abs Immature Granulocytes: 0.03 10*3/uL (ref 0.00–0.07)
Basophils Absolute: 0.1 10*3/uL (ref 0.0–0.1)
Basophils Relative: 1 %
Eosinophils Absolute: 0.3 10*3/uL (ref 0.0–0.5)
Eosinophils Relative: 4 %
HCT: 41.3 % (ref 39.0–52.0)
Hemoglobin: 13.4 g/dL (ref 13.0–17.0)
Immature Granulocytes: 0 %
Lymphocytes Relative: 20 %
Lymphs Abs: 1.6 10*3/uL (ref 0.7–4.0)
MCH: 28.3 pg (ref 26.0–34.0)
MCHC: 32.4 g/dL (ref 30.0–36.0)
MCV: 87.3 fL (ref 80.0–100.0)
Monocytes Absolute: 0.8 10*3/uL (ref 0.1–1.0)
Monocytes Relative: 10 %
Neutro Abs: 5 10*3/uL (ref 1.7–7.7)
Neutrophils Relative %: 65 %
Platelets: 339 10*3/uL (ref 150–400)
RBC: 4.73 MIL/uL (ref 4.22–5.81)
RDW: 12.7 % (ref 11.5–15.5)
WBC: 7.7 10*3/uL (ref 4.0–10.5)
nRBC: 0 % (ref 0.0–0.2)

## 2018-07-18 LAB — COMPREHENSIVE METABOLIC PANEL
ALT: 20 U/L (ref 0–44)
AST: 25 U/L (ref 15–41)
Albumin: 4.1 g/dL (ref 3.5–5.0)
Alkaline Phosphatase: 61 U/L (ref 38–126)
Anion gap: 11 (ref 5–15)
BUN: 25 mg/dL — ABNORMAL HIGH (ref 6–20)
CO2: 25 mmol/L (ref 22–32)
Calcium: 9.4 mg/dL (ref 8.9–10.3)
Chloride: 103 mmol/L (ref 98–111)
Creatinine, Ser: 1.82 mg/dL — ABNORMAL HIGH (ref 0.61–1.24)
GFR calc Af Amer: 48 mL/min — ABNORMAL LOW (ref >60)
GFR calc non Af Amer: 42 mL/min — ABNORMAL LOW (ref >60)
Glucose, Bld: 101 mg/dL — ABNORMAL HIGH (ref 70–99)
Potassium: 3.9 mmol/L (ref 3.5–5.1)
Sodium: 139 mmol/L (ref 135–145)
Total Bilirubin: 0.5 mg/dL (ref 0.3–1.2)
Total Protein: 7 g/dL (ref 6.5–8.1)

## 2018-07-18 LAB — RAPID URINE DRUG SCREEN, HOSP PERFORMED
Amphetamines: NOT DETECTED
Barbiturates: NOT DETECTED
Benzodiazepines: NOT DETECTED
Cocaine: NOT DETECTED
Opiates: NOT DETECTED
Tetrahydrocannabinol: NOT DETECTED

## 2018-07-18 LAB — TROPONIN I: Troponin I: <0.03 ng/mL (ref <0.03)

## 2018-07-18 LAB — URINE CULTURE: CULTURE: NO GROWTH

## 2018-07-18 LAB — LACTIC ACID, PLASMA
Lactic Acid, Venous: 1.3 mmol/L (ref 0.5–1.9)
Lactic Acid, Venous: 0.9 mmol/L (ref 0.5–1.9)

## 2018-07-18 LAB — ETHANOL: Alcohol, Ethyl (B): <10 mg/dL (ref <10)

## 2018-07-18 LAB — AMMONIA: Ammonia: 20 umol/L (ref 9–35)

## 2018-07-18 MED ORDER — SODIUM CHLORIDE 0.9 % IV BOLUS
1000.0000 mL | Freq: Once | INTRAVENOUS | Status: AC
  Administered 2018-07-18: 1000 mL via INTRAVENOUS

## 2018-07-18 NOTE — ED Notes (Addendum)
Spoke with Erin from [facility]. Per SW, awaiting information for patient to be placed in homeless shelter upon dispo. Will contact this RN when placement is confirmed. Gerald Stabs, [place] and patient updated to plan.

## 2018-07-18 NOTE — ED Provider Notes (Signed)
[facility] EMERGENCY DEPARTMENT Provider Note   CSN: [id number] Arrival date & time: 07/18/18  0101    History   Chief Complaint Chief Complaint  Patient presents with  . Chest Pain    HPI Lior Hoen is a 53 y.o. male with a history of kidney cancer, CAD s/p CABG x3 in March 2019, hypertension, major depressive disorder, methamphetamine abuse who presents to the emergency department by EMS with a chief complaint of chest pain.  The patient reports he developed a chest pain or shortness of breath approximately 3 hours prior to arrival.  The patient states the chest pain was central, characterized as tightness.   Per triage note, the patient reported that he has been having central chest pain since around noon.  He was given nitro by EMS and reported minimal improvement in his pain.  EMS reports patient's systolic blood pressure was 140 prior to nitro, but decreased to 90 after he was given nitro.  Patient reports his Special educational needs teacher" tested positive for COVID-19 2-3 weeks ago.  He reports he has been driving the individual around and is concerned he is now positive. He reports he developed a nonproductive cough 6 days ago and fever 4 days ago.  Patient was seen on 3/30 for the same symptoms and was advised to go home and self quarantine.  He denies nausea, vomiting, diarrhea, abdominal pain, visual changes, rash, numbness, weakness, palpitations, or leg swelling.  The patient reports that he has been kicked out of his sober living home and has been sleeping on a park bench as or wherever he can rest.  He states that he has not slept in the last 2 days.  The patient continues to fall asleep mid-sentence, and is arousable to loud voice and pain.  He answers questions appropriately.  He reports that he is drowsy because he took all of his home medications at 20:00.  Per chart review, [facility] notes cardiac work up which includes:    Echocardiogram findings (10/17/17): The left ventricular systolic function is normal, ejection fraction is 55-60%. The left ventricular cavity size is normal. There is left ventricular hypertrophy present. Left ventricular diastolic function is abnormal. Moderate (grade II) showing pseudonormalization pattern. The right ventricular cavity size and systolic function is/are normal.  Cardiac catheterization findings done at [facility] (05/10/17): Normal left ventricular filling pressure. Moderately reduced left ventricular systolic function with regional wall motion abnormalities. Coronary artery disease - severe involving the right coronary artery. Coronary artery disease - none involving the left main coronary artery. Coronary artery disease -severe involving the left anterior descending coronary artery. Coronary artery disease - moderate to severe involving the circumflex coronary artery.  CTA of abdomen / pelvis findings done at [facility] (01/10/12): Unremarkable sequelae partial left nephrectomy without discrete tumor at the surgical margin. No enhancing renal mass. No evidence for metastatic disease. Nonobstructing right renal lower pole stones measuring up to 4 mm.  Carotid ultrasound findings done at [facility] (05/10/17): No evidence of hemodynamically significant carotid stenosis bilaterally. CLASS I (0-39%). The vertebral arteries demonstrate normal Doppler waveforms and velocities."  Ayce Perriello was evaluated in Emergency Department on 07/18/2018 for the symptoms described in the history of present illness. He was evaluated in the context of the global COVID-19 pandemic, which necessitated consideration that the patient might be at risk for infection with the SARS-CoV-2 virus that causes COVID-19. Institutional protocols and algorithms that pertain to the evaluation of patients at risk for COVID-19  are in a state of rapid change based on information released by regulatory bodies including the CDC  and federal and state organizations. These policies and algorithms were followed during the patient's care in the ED.     The history is provided by the patient. No language interpreter was used.    Past Medical History:  Diagnosis Date  . Cancer ([place])   . Hypertension   . Renal disorder     Patient Active Problem List   Diagnosis Date Noted  . 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 Methamphetamine abuse ([place]) 07/02/2018  . Major depressive disorder, recurrent severe without psychotic features ([place]) 07/02/2018  . Hypertensive urgency 05/12/2018    Past Surgical History:  Procedure Laterality Date  . CARDIAC SURGERY    . CYSTOSCOPY    . LITHOTRIPSY          Home Medications    Prior to Admission medications   Medication Sig Start Date End Date Taking? Authorizing Provider  albuterol (PROVENTIL HFA;VENTOLIN HFA) 108 (90 Base) MCG/ACT inhaler Inhale 2 puffs into the lungs every 6 (six) hours as needed for wheezing or shortness of breath. 07/11/18   Connye Burkitt, NP  amLODipine (NORVASC) 10 MG tablet Take 1 tablet (10 mg total) by mouth daily. For high blood pressure 07/11/18   Connye Burkitt, NP  atorvastatin (LIPITOR) 40 MG tablet Take 1 tablet (40 mg total) by mouth at bedtime. For high cholesterol 07/11/18   Connye Burkitt, NP  carvedilol (COREG) 25 MG tablet Take 1 tablet (25 mg total) by mouth daily. For high blood pressure Patient taking differently: Take 25 mg by mouth 3 (three) times daily. For high blood pressure 07/12/18   Connye Burkitt, NP  cloNIDine (CATAPRES) 0.3 MG tablet Take 1 tablet (0.3 mg total) by mouth 3 (three) times daily. For high blood pressure 07/11/18   Connye Burkitt, NP  haloperidol (HALDOL) 10 MG tablet Take 1 tablet (10 mg total) by mouth at bedtime. For psychosis 07/11/18   Connye Burkitt, NP  haloperidol (HALDOL) 2 MG tablet Take 1 tablet  (2 mg total) by mouth every morning. For psychosis 07/12/18   Connye Burkitt, NP  lactulose (CHRONULAC) 10 GM/15ML solution Take 45 mLs (30 g total) by mouth daily. For high ammonia Patient not taking: Reported on 07/16/2018 07/12/18   Connye Burkitt, NP  neomycin-bacitracin-polymyxin (NEOSPORIN) OINT Apply 1 application topically 3 (three) times daily. To right wrist Patient not taking: Reported on 07/16/2018 07/11/18   Connye Burkitt, NP  nicotine (NICODERM CQ - DOSED IN MG/24 HOURS) 21 mg/24hr patch Place 1 patch (21 mg total) onto the skin daily. For smoking cessation Patient not taking: Reported on 07/16/2018 07/12/18   Connye Burkitt, NP  sertraline (ZOLOFT) 100 MG tablet Take 1 tablet (100 mg total) by mouth daily. For mood 07/12/18   Connye Burkitt, NP  sodium chloride (OCEAN) 0.65 % SOLN nasal spray Place 1 spray into both nostrils every other day.    [provider]  tamsulosin (FLOMAX) 0.4 MG CAPS capsule Take 1 capsule (0.4 mg total) by mouth daily. For BPH 07/11/18   Connye Burkitt, NP  traZODone (DESYREL) 100 MG tablet Take 1 tablet (100 mg total) by mouth at bedtime as needed for sleep (May repeat x 1 after 1 hour). 07/11/18   Connye Burkitt, NP  Family History No family history on file.  Social History Social History   Tobacco Use  . Smoking status: Current Every Day Smoker    Types: E-cigarettes  . Smokeless tobacco: Former Systems developer    Types: Chew    Quit date: 05/12/2008  Substance Use Topics  . Alcohol use: Not Currently  . Drug use: Yes     Allergies   Toradol [ketorolac tromethamine] and Sulfa antibiotics   Review of Systems Review of Systems  Constitutional: Positive for chills and fever. Negative for appetite change.  HENT: Negative for congestion and sore throat.   Respiratory: Positive for cough and shortness of breath. Negative for wheezing.   Cardiovascular: Positive for chest pain. Negative for palpitations and leg swelling.  Gastrointestinal: Negative  for abdominal pain, diarrhea, nausea and vomiting.  Genitourinary: Negative for dysuria, frequency and urgency.  Musculoskeletal: Negative for back pain, gait problem, neck pain and neck stiffness.  Skin: Negative for rash.  Allergic/Immunologic: Negative for immunocompromised state.  Neurological: Negative for dizziness, weakness, numbness and headaches.  Psychiatric/Behavioral: Negative for confusion.     Physical Exam Updated Vital Signs BP 120/82   Pulse 71   Temp (!) 96.3 F (35.7 C) (Rectal)   Resp 18   Ht 5\' 5"  (1.651 m)   Wt 68 kg   SpO2 98%   BMI 24.95 kg/m   Physical Exam Vitals signs and nursing note reviewed.  Constitutional:      Appearance: He is well-developed.  HENT:     Head: Normocephalic.  Eyes:     Conjunctiva/sclera: Conjunctivae normal.  Neck:     Musculoskeletal: Neck supple.  Cardiovascular:     Rate and Rhythm: Normal rate and regular rhythm.     Pulses:          Radial pulses are 2+ on the right side and 2+ on the left side.       Dorsalis pedis pulses are 2+ on the right side and 2+ on the left side.     Heart sounds: No murmur.  Pulmonary:     Effort: Pulmonary effort is normal. No tachypnea, accessory muscle usage, respiratory distress or retractions.     Comments: No air hunger.  Abdominal:     General: There is no distension.     Palpations: Abdomen is soft. There is no mass.     Tenderness: There is no abdominal tenderness. There is no right CVA tenderness, left CVA tenderness, guarding or rebound.     Hernia: No hernia is present.  Musculoskeletal:     Right lower leg: No edema.     Left lower leg: No edema.  Skin:    General: Skin is warm and dry.  Neurological:     Mental Status: He is alert.     Comments: Somnolent, but arousable to voice.  Alert and oriented x3.  Moves all 4 extremities.  Follows simple commands.  Psychiatric:        Behavior: Behavior normal.      ED Treatments / Results  Labs (all labs ordered are  listed, but only abnormal results are displayed) Labs Reviewed  COMPREHENSIVE METABOLIC PANEL - Abnormal; Notable for the following components:      Result Value   Glucose, Bld 101 (*)    BUN 25 (*)    Creatinine, Ser 1.82 (*)    GFR calc non Af Amer 42 (*)    GFR calc Af Amer 48 (*)    All other components within normal  limits  POCT I-STAT EG7 - Abnormal; Notable for the following components:   pCO2, Ven 41.4 (*)    pO2, Ven 57.0 (*)    HCT 35.0 (*)    Hemoglobin 11.9 (*)    All other components within normal limits  CULTURE, BLOOD (ROUTINE X 2)  CULTURE, BLOOD (ROUTINE X 2)  CBC WITH DIFFERENTIAL/PLATELET  TROPONIN I  LACTIC ACID, PLASMA  LACTIC ACID, PLASMA  AMMONIA  RAPID URINE DRUG SCREEN, HOSP PERFORMED  ETHANOL  BLOOD GAS, ARTERIAL    EKG None  Radiology Dg Chest Portable 1 View  Result Date: 07/18/2018 CLINICAL DATA:  Chest tightness and fever for 4 days. EXAM: PORTABLE CHEST 1 VIEW COMPARISON:  07/16/2018 FINDINGS: Previous median sternotomy and CABG procedure. Normal heart size. No pleural effusion or edema. No airspace opacities. IMPRESSION: No active disease. Electronically Signed   By: Kerby Moors M.D.   On: 07/18/2018 02:27   Dg Chest Portable 1 View  Result Date: 07/16/2018 CLINICAL DATA:  52 year old male with shortness of breath and cough. EXAM: PORTABLE CHEST 1 VIEW COMPARISON:  Chest radiograph dated 07/02/2018 FINDINGS: The lungs are clear. There is no pleural effusion pneumothorax. Top-normal cardiac size. Median sternotomy wires and CABG vascular clips and atrial appendage occlusive device. No acute osseous pathology. IMPRESSION: No active disease. Electronically Signed   By: Anner Crete M.D.   On: 07/16/2018 21:39    Procedures Procedures (including critical care time)  Medications Ordered in ED Medications  sodium chloride 0.9 % bolus 1,000 mL (1,000 mLs Intravenous New Bag/Given 07/18/18 0253)  sodium chloride 0.9 % bolus 1,000 mL (0 mLs  Intravenous Stopped 07/18/18 3419)     Initial Impression / Assessment and Plan / ED Course  I have reviewed the triage vital signs and the nursing notes.  Pertinent labs & imaging results that were available during my care of the patient were reviewed by me and considered in my medical decision making (see chart for details).        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  He is somnolent, but arousable to voice on arrival and states this is because he has not slept in 2 days because he is currently suffering from homelessness after getting kicked out of his sober living house.  He has a history of methamphetamine abuse.  He also endorses subjective fever, chills, and productive cough for the last 4 to 6 days.  He endorses a positive COVID-19 exposure for which he spent an extended amount of time in close proximity per the patient.  He was seen in the ER 2 days ago for the same with a reassuring work-up and was advised to quarantine at home.  Patient was seen and independently evaluated along with Dr. Leonette Monarch, attending physician.  He was given nitroglycerin by EMS and became hypotensive with systolic pressure in the 37T.  Systolic pressure decreased to the 80s after arrival in the ER, which was treated with 2 L of IV fluid boluses and improved to 024-097D systolic.  EKG with sinus rhythm and appears unchanged from previous.  Troponin is negative.  I also considered unknown substance use given the patient's somnolence, hyperammonemia, and hypercarbia.  ABG with pH 7.35 but RT reports this is likely mixed venous blood.  Creatinine is 1.82, slightly increased from previous.  UDS is negative.  Chest x-ray is unremarkable.  Patient has  remained afebrile since arrival in the ER.  I have a low suspicion for ACS at this time given his reassuring work-up today and 2  days ago.  Please review HPI for further cardiac work-ups over the last 6 to 8 months.   Given the patient's reported COVID-19 exposure, will place social work consult to inquire about housing for potential COVID-19 patient although his initial work-up today does not appear consistent with COVID-19 infection.  He does not meet testing criteria at this time.  Patient care transferred to [place] at the end of my shift pending social work consult. Patient presentation, ED course, and plan of care discussed with review of all pertinent labs and imaging. Please see his/her note for further details regarding further ED course and disposition.  Final Clinical Impressions(s) / ED Diagnoses   Final diagnoses:  None    ED Discharge Orders    None       Joanne Gavel, PA-C 07/18/18 0847    Fatima Blank, MD 07/19/18 0530

## 2018-07-18 NOTE — Progress Notes (Signed)
CSW spoke with Surveyor, quantity regarding potential "hotel voucher"- patients may only qualify for hotel voucher in the event they are positive for COVID-19 are have no other living arrangements. Patient does not qualify at this time.   CSW left voicemail for Anner Crete to see if patient could go to [facility]- awaiting call back. Patient able to make his own decisions regarding disposition, if he has friends/ family to stay with he is able to go.   Kingsley Spittle, [place]  [phone]

## 2018-07-18 NOTE — ED Notes (Signed)
EDP updated on patient condition. Pt asleep at this time.

## 2018-07-18 NOTE — Progress Notes (Signed)
CSW aware of consult- will follow up.   Kingsley Spittle, [place]  [phone]

## 2018-07-18 NOTE — ED Notes (Signed)
Patient verbalizes understanding of discharge instructions. Opportunity for questioning and answers were provided. Armband removed by staff, pt discharged from ED ambulatory to home.  

## 2018-07-18 NOTE — ED Triage Notes (Addendum)
Pt was seen at [facility] on Sunday for same.  Pt told to go home and self quarantine.  Pt states he is still having chest tightness similar to Sunday. Pt also seen on 06/30/18 for chest pain and on 07/01/18 for major depressive disorder at [facility] then to [facility] on 07/02/18.  Pt denies SI/HI.  History of Methamphetamine abuse. Pt stated he has been running a fever for 4 days.  However, he was not running a fever at [facility], nor at this time.  Pt refusing IV tempts.

## 2018-07-18 NOTE — ED Notes (Addendum)
Spoke with social work regarding patient's plan of care. Per SW, will call this RN back when plan for patient dispo is finalized. SW given this RN's phone number for update.

## 2018-07-18 NOTE — ED Triage Notes (Signed)
Pt arrives to ED with complaints of central chest pain since 3/31 around noon. EMS reports initial 4/10 central chest pain. 1 nitro given then pain 2/10. Hx CABG, MI. BP 90 systolic after nitro given, 256 systolic prior to nitro. HR-80 Denies shortness of breath Pt dx with URI 2 days ago - not tested for COVID  Pt placed in position of comfort with bed locked and lowered, call bell in reach.

## 2018-07-18 NOTE — Discharge Instructions (Addendum)
Return here as needed. Follow up with your doctor. °

## 2018-07-23 LAB — CULTURE, BLOOD (ROUTINE X 2)
Culture: NO GROWTH
Culture: NO GROWTH
Special Requests: ADEQUATE

## 2020-06-07 IMAGING — CT CT HEAD WITHOUT CONTRAST
3 series · 15 of 47 positions shown, 18 images · non-contrast
Comparison: None.

CLINICAL DATA: Altered LOC

EXAM:
CT HEAD WITHOUT CONTRAST
TECHNIQUE: Contiguous axial images were obtained from the base of the skull
through the vertex without intravenous contrast.

[Series 2: head wo · axial · 0.47mm/px · z∈[-45,+80]mm · 9 of 30 slices shown, 12 images]
[im 3/30  brain]
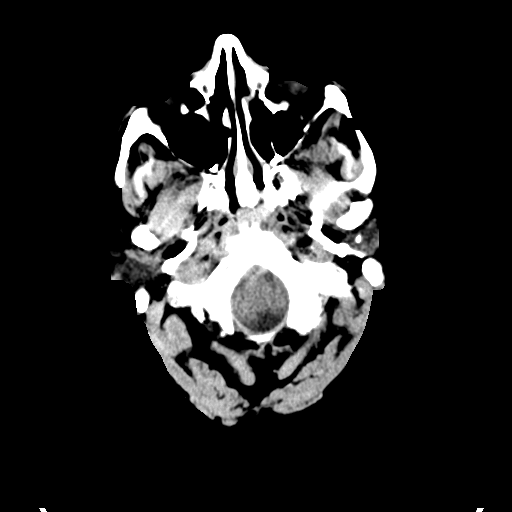
[im 3/30  bone]
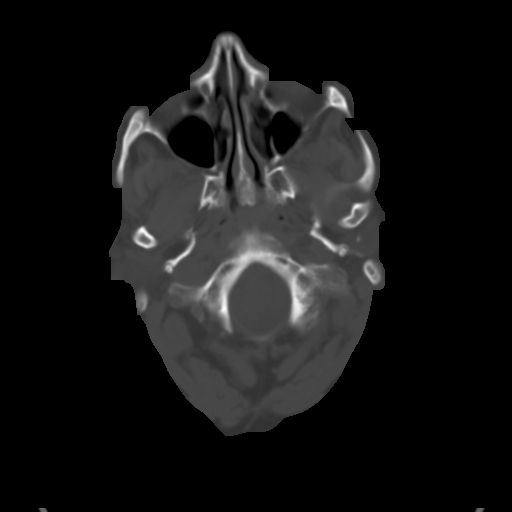
[im 6/30  brain]
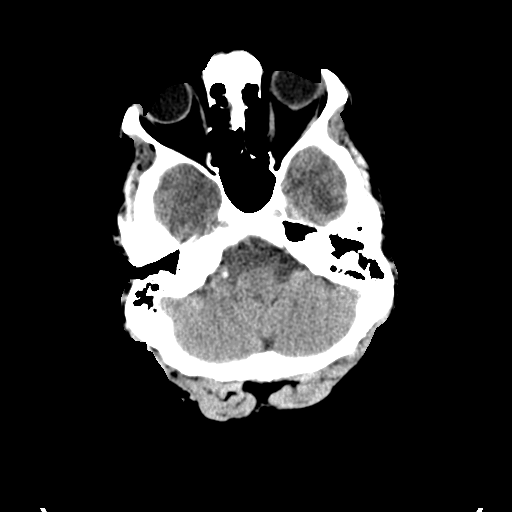
[im 9/30  brain]
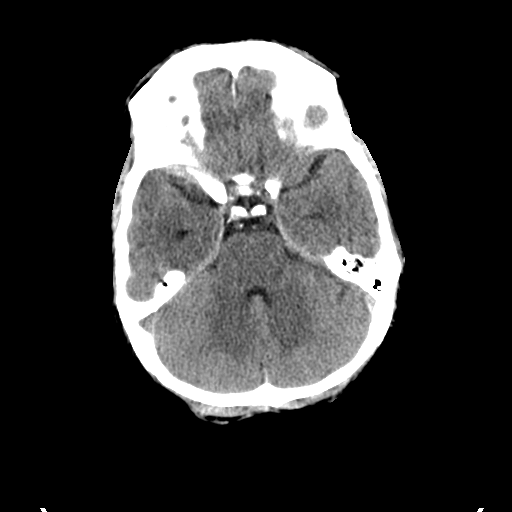
[im 12/30  brain]
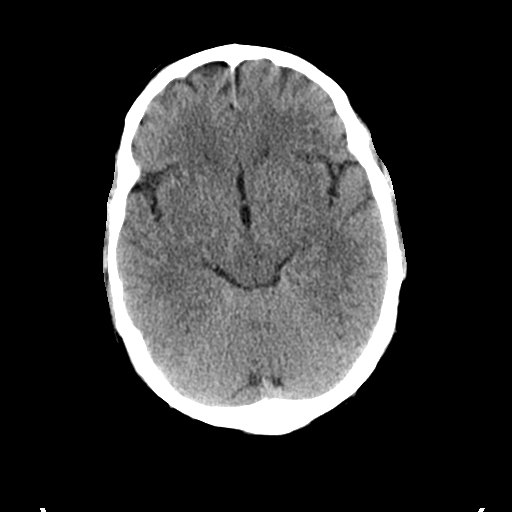
[im 16/30  brain]
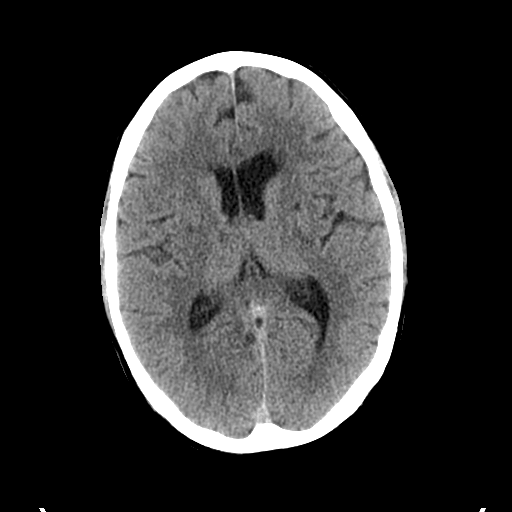
[im 16/30  bone]
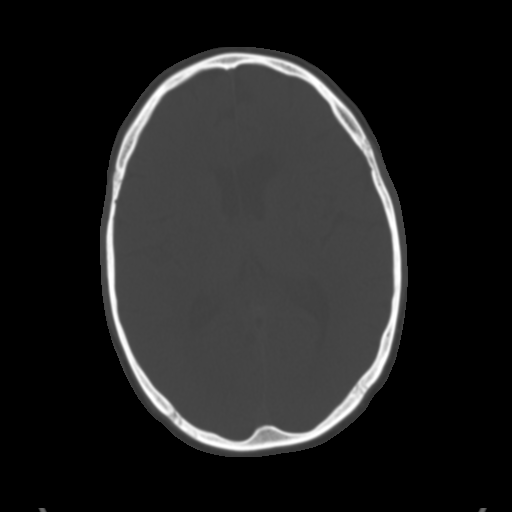
[im 19/30  brain]
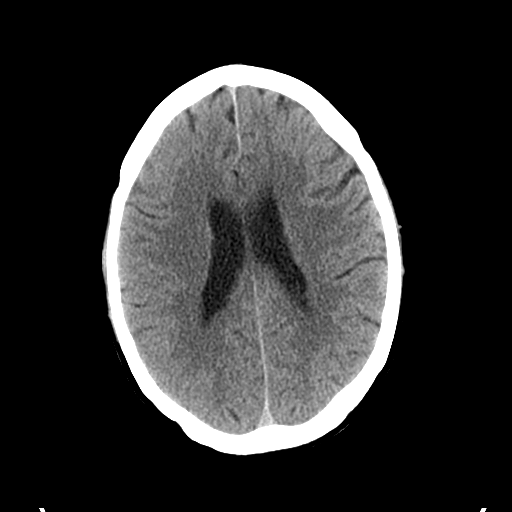
[im 22/30  brain]
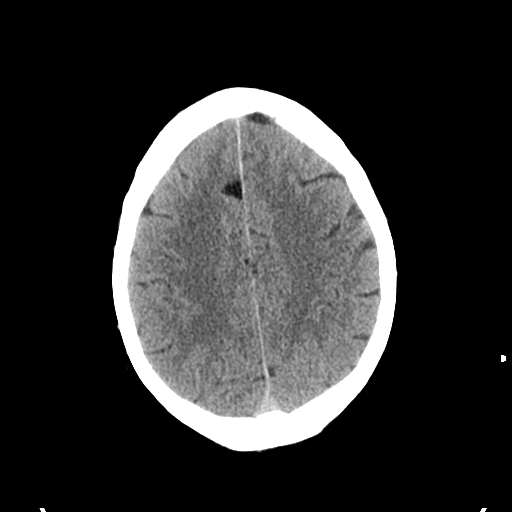
[im 25/30  brain]
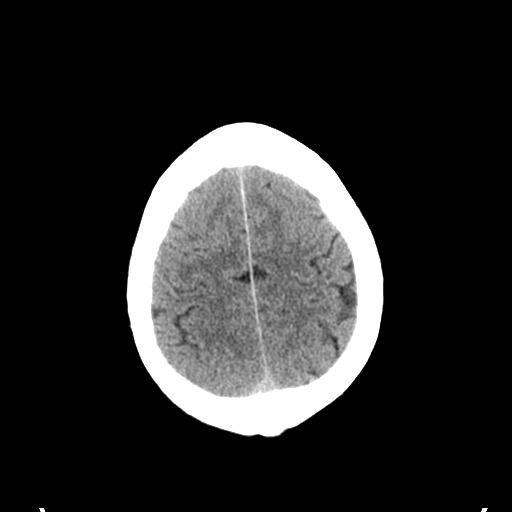
[im 28/30  brain]
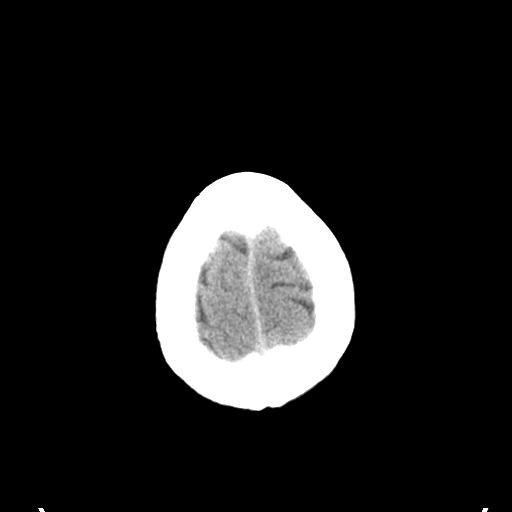
[im 28/30  bone]
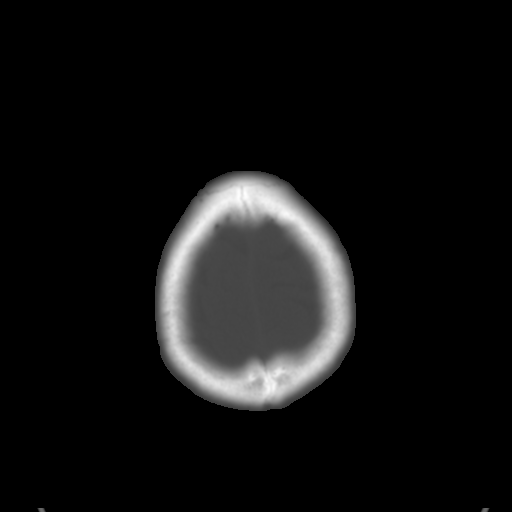

[Series 5: coronal soft tissue · coronal · 0.29mm/px · 3 of 66 slices shown]
[im 22/66  brain]
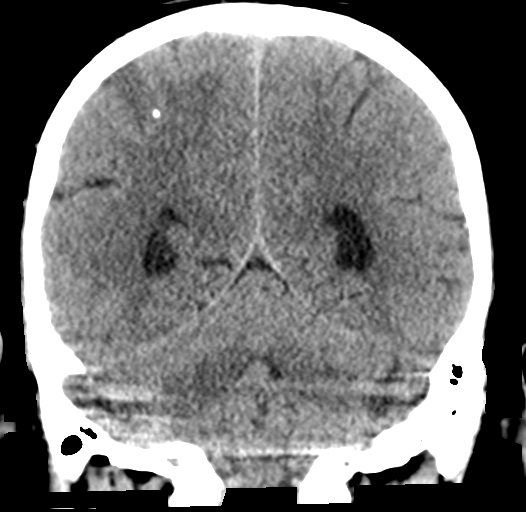
[im 29/66  brain]
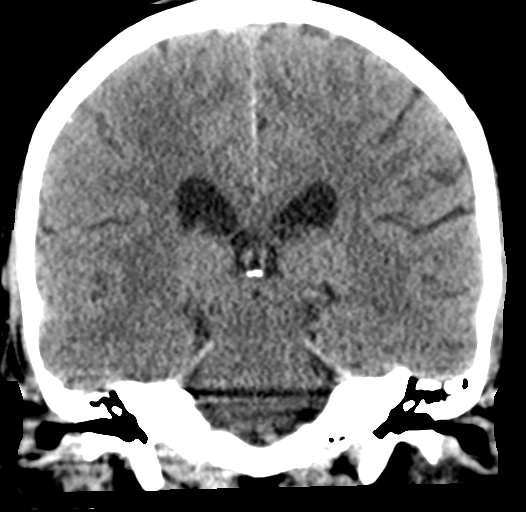
[im 37/66  brain]
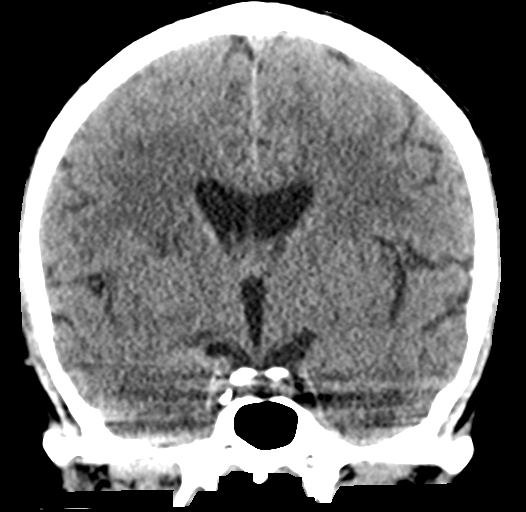

[Series 6: sagittal soft tissue · sagittal · 0.30mm/px · 3 of 47 slices shown]
[im 16/47  brain]
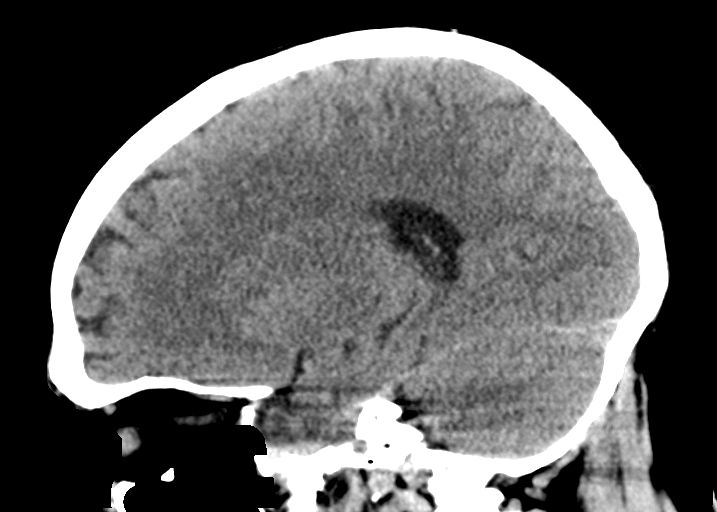
[im 24/47  brain]
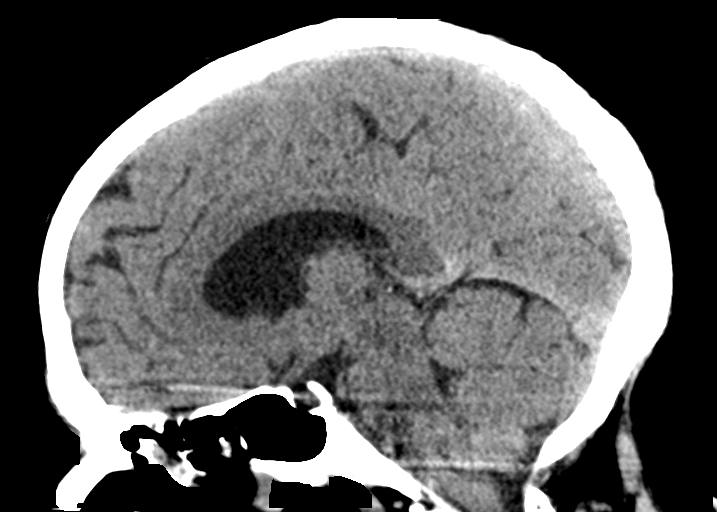
[im 31/47  brain]
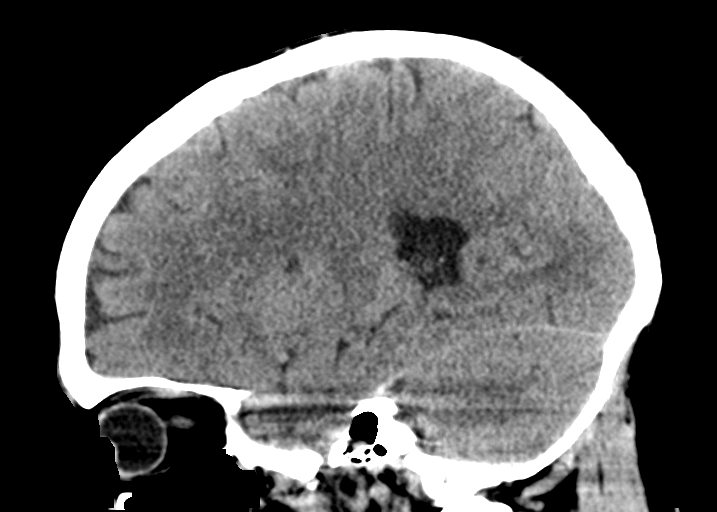

[15 of 47 positions shown; findings below may reference images not displayed]

FINDINGS: Brain: No acute territorial infarction, hemorrhage or intracranial
mass. Vague hypodensities within the periventricular and deep white
matter which may reflect small vessel ischemic change or age
indeterminate lacunar infarcts. Nonenlarged ventricles.

Vascular: No hyperdense vessels.  No unexpected calcification

Skull: Normal. Negative for fracture or focal lesion.

Sinuses/Orbits: Mild mucosal thickening in the ethmoid sinuses

Other: None
IMPRESSION: 1. Negative for hemorrhage or intracranial mass
2. Small foci of hypodensity within the white matter, may reflect
small vessel ischemic change or age indeterminate lacunar infarcts.
